# Patient Record
Sex: Female | Born: 1955
Health system: Southern US, Community
[De-identification: ages and names within clinical notes are randomized; demographics above are authoritative.]

## PROBLEM LIST (undated history)

## (undated) DIAGNOSIS — R109 Unspecified abdominal pain: Secondary | ICD-10-CM

## (undated) DIAGNOSIS — C189 Malignant neoplasm of colon, unspecified: Secondary | ICD-10-CM

## (undated) DIAGNOSIS — B373 Candidiasis of vulva and vagina: Secondary | ICD-10-CM

## (undated) DIAGNOSIS — D649 Anemia, unspecified: Secondary | ICD-10-CM

## (undated) DIAGNOSIS — D219 Benign neoplasm of connective and other soft tissue, unspecified: Secondary | ICD-10-CM

## (undated) DIAGNOSIS — K829 Disease of gallbladder, unspecified: Secondary | ICD-10-CM

## (undated) DIAGNOSIS — B3731 Acute candidiasis of vulva and vagina: Secondary | ICD-10-CM

## (undated) DIAGNOSIS — C180 Malignant neoplasm of cecum: Secondary | ICD-10-CM

## (undated) DIAGNOSIS — I10 Essential (primary) hypertension: Secondary | ICD-10-CM

## (undated) DIAGNOSIS — M1990 Unspecified osteoarthritis, unspecified site: Secondary | ICD-10-CM

## (undated) DIAGNOSIS — K279 Peptic ulcer, site unspecified, unspecified as acute or chronic, without hemorrhage or perforation: Secondary | ICD-10-CM

## (undated) HISTORY — DX: Disease of gallbladder, unspecified: K82.9

## (undated) HISTORY — DX: Anemia, unspecified: D64.9

## (undated) HISTORY — DX: Malignant neoplasm of cecum: C18.0

## (undated) HISTORY — PX: COLONOSCOPY: SHX174

## (undated) HISTORY — DX: Essential (primary) hypertension: I10

## (undated) HISTORY — PX: COLON SURGERY: SHX602

## (undated) HISTORY — DX: Unspecified osteoarthritis, unspecified site: M19.90

## (undated) HISTORY — DX: Peptic ulcer, site unspecified, unspecified as acute or chronic, without hemorrhage or perforation: K27.9

## (undated) HISTORY — DX: Benign neoplasm of connective and other soft tissue, unspecified: D21.9

## (undated) HISTORY — DX: Acute candidiasis of vulva and vagina: B37.31

## (undated) HISTORY — DX: Unspecified abdominal pain: R10.9

## (undated) HISTORY — DX: Malignant neoplasm of colon, unspecified: C18.9

## (undated) HISTORY — PX: OTHER SURGICAL HISTORY: SHX169

## (undated) HISTORY — DX: Candidiasis of vulva and vagina: B37.3

---

## 2003-08-30 ENCOUNTER — Inpatient Hospital Stay (HOSPITAL_COMMUNITY): Admission: EM | Admit: 2003-08-30 | Discharge: 2003-09-12 | Payer: Self-pay | Admitting: Emergency Medicine

## 2003-09-18 ENCOUNTER — Encounter: Admission: RE | Admit: 2003-09-18 | Discharge: 2003-09-18 | Payer: Self-pay | Admitting: Oncology

## 2003-09-18 ENCOUNTER — Encounter (HOSPITAL_COMMUNITY): Admission: RE | Admit: 2003-09-18 | Discharge: 2003-10-18 | Payer: Self-pay | Admitting: Oncology

## 2003-09-28 ENCOUNTER — Ambulatory Visit (HOSPITAL_COMMUNITY): Admission: RE | Admit: 2003-09-28 | Discharge: 2003-09-28 | Payer: Self-pay | Admitting: *Deleted

## 2003-10-22 ENCOUNTER — Encounter: Admission: RE | Admit: 2003-10-22 | Discharge: 2003-10-22 | Payer: Self-pay | Admitting: Oncology

## 2003-10-22 ENCOUNTER — Encounter (HOSPITAL_COMMUNITY): Admission: RE | Admit: 2003-10-22 | Discharge: 2003-11-21 | Payer: Self-pay | Admitting: Oncology

## 2003-11-27 ENCOUNTER — Encounter (HOSPITAL_COMMUNITY): Admission: RE | Admit: 2003-11-27 | Discharge: 2003-12-27 | Payer: Self-pay | Admitting: Oncology

## 2003-11-27 ENCOUNTER — Encounter: Admission: RE | Admit: 2003-11-27 | Discharge: 2003-11-27 | Payer: Self-pay | Admitting: Oncology

## 2003-12-25 ENCOUNTER — Ambulatory Visit (HOSPITAL_COMMUNITY): Payer: Self-pay | Admitting: Oncology

## 2004-01-04 ENCOUNTER — Encounter: Admission: RE | Admit: 2004-01-04 | Discharge: 2004-01-04 | Payer: Self-pay | Admitting: Oncology

## 2004-01-04 ENCOUNTER — Encounter (HOSPITAL_COMMUNITY): Admission: RE | Admit: 2004-01-04 | Discharge: 2004-02-03 | Payer: Self-pay | Admitting: Oncology

## 2004-02-08 ENCOUNTER — Encounter: Admission: RE | Admit: 2004-02-08 | Discharge: 2004-02-08 | Payer: Self-pay | Admitting: Oncology

## 2004-02-08 ENCOUNTER — Encounter (HOSPITAL_COMMUNITY): Admission: RE | Admit: 2004-02-08 | Discharge: 2004-02-27 | Payer: Self-pay | Admitting: Oncology

## 2004-03-21 ENCOUNTER — Ambulatory Visit (HOSPITAL_COMMUNITY): Payer: Self-pay | Admitting: Oncology

## 2004-03-21 ENCOUNTER — Encounter: Admission: RE | Admit: 2004-03-21 | Discharge: 2004-03-21 | Payer: Self-pay | Admitting: Oncology

## 2004-03-29 ENCOUNTER — Emergency Department (HOSPITAL_COMMUNITY): Admission: EM | Admit: 2004-03-29 | Discharge: 2004-03-29 | Payer: Self-pay | Admitting: Emergency Medicine

## 2004-04-01 ENCOUNTER — Other Ambulatory Visit: Admission: RE | Admit: 2004-04-01 | Discharge: 2004-04-01 | Payer: Self-pay | Admitting: *Deleted

## 2004-05-02 ENCOUNTER — Encounter: Admission: RE | Admit: 2004-05-02 | Discharge: 2004-05-02 | Payer: Self-pay | Admitting: Oncology

## 2004-05-02 ENCOUNTER — Encounter (HOSPITAL_COMMUNITY): Admission: RE | Admit: 2004-05-02 | Discharge: 2004-06-01 | Payer: Self-pay | Admitting: Oncology

## 2004-05-09 ENCOUNTER — Ambulatory Visit (HOSPITAL_COMMUNITY): Payer: Self-pay | Admitting: Oncology

## 2004-06-13 ENCOUNTER — Encounter (HOSPITAL_COMMUNITY): Admission: RE | Admit: 2004-06-13 | Discharge: 2004-07-13 | Payer: Self-pay | Admitting: Oncology

## 2004-06-13 ENCOUNTER — Encounter: Admission: RE | Admit: 2004-06-13 | Discharge: 2004-06-13 | Payer: Self-pay | Admitting: Oncology

## 2004-07-25 ENCOUNTER — Encounter: Admission: RE | Admit: 2004-07-25 | Discharge: 2004-07-25 | Payer: Self-pay | Admitting: Oncology

## 2004-07-25 ENCOUNTER — Ambulatory Visit (HOSPITAL_COMMUNITY): Payer: Self-pay | Admitting: Oncology

## 2004-09-03 ENCOUNTER — Encounter (HOSPITAL_COMMUNITY): Admission: RE | Admit: 2004-09-03 | Discharge: 2004-10-03 | Payer: Self-pay | Admitting: Oncology

## 2004-09-03 ENCOUNTER — Encounter: Admission: RE | Admit: 2004-09-03 | Discharge: 2004-09-03 | Payer: Self-pay | Admitting: Oncology

## 2004-10-15 ENCOUNTER — Encounter: Admission: RE | Admit: 2004-10-15 | Discharge: 2004-10-15 | Payer: Self-pay | Admitting: Oncology

## 2004-10-15 ENCOUNTER — Ambulatory Visit (HOSPITAL_COMMUNITY): Payer: Self-pay | Admitting: Oncology

## 2004-11-24 ENCOUNTER — Encounter: Admission: RE | Admit: 2004-11-24 | Discharge: 2004-11-24 | Payer: Self-pay | Admitting: Oncology

## 2004-11-24 ENCOUNTER — Encounter (HOSPITAL_COMMUNITY): Admission: RE | Admit: 2004-11-24 | Discharge: 2004-12-24 | Payer: Self-pay | Admitting: Oncology

## 2005-01-07 ENCOUNTER — Ambulatory Visit (HOSPITAL_COMMUNITY): Payer: Self-pay | Admitting: Oncology

## 2005-01-07 ENCOUNTER — Encounter: Admission: RE | Admit: 2005-01-07 | Discharge: 2005-01-07 | Payer: Self-pay | Admitting: Oncology

## 2005-02-18 ENCOUNTER — Encounter (HOSPITAL_COMMUNITY): Admission: RE | Admit: 2005-02-18 | Discharge: 2005-02-18 | Payer: Self-pay | Admitting: Oncology

## 2005-02-18 ENCOUNTER — Encounter: Admission: RE | Admit: 2005-02-18 | Discharge: 2005-02-18 | Payer: Self-pay | Admitting: Oncology

## 2005-02-24 ENCOUNTER — Encounter: Admission: RE | Admit: 2005-02-24 | Discharge: 2005-02-24 | Payer: Self-pay | Admitting: Oncology

## 2005-02-24 ENCOUNTER — Ambulatory Visit (HOSPITAL_COMMUNITY): Payer: Self-pay | Admitting: Oncology

## 2005-04-01 ENCOUNTER — Encounter: Admission: RE | Admit: 2005-04-01 | Discharge: 2005-04-01 | Payer: Self-pay | Admitting: Oncology

## 2005-04-01 ENCOUNTER — Encounter (HOSPITAL_COMMUNITY): Admission: RE | Admit: 2005-04-01 | Discharge: 2005-05-01 | Payer: Self-pay | Admitting: Oncology

## 2005-05-13 ENCOUNTER — Encounter: Admission: RE | Admit: 2005-05-13 | Discharge: 2005-05-13 | Payer: Self-pay | Admitting: Oncology

## 2005-05-13 ENCOUNTER — Ambulatory Visit (HOSPITAL_COMMUNITY): Payer: Self-pay | Admitting: Oncology

## 2005-05-13 ENCOUNTER — Encounter (HOSPITAL_COMMUNITY): Admission: RE | Admit: 2005-05-13 | Discharge: 2005-06-12 | Payer: Self-pay | Admitting: Oncology

## 2005-06-25 ENCOUNTER — Encounter: Admission: RE | Admit: 2005-06-25 | Discharge: 2005-06-25 | Payer: Self-pay | Admitting: Oncology

## 2005-08-07 ENCOUNTER — Encounter (HOSPITAL_COMMUNITY): Admission: RE | Admit: 2005-08-07 | Discharge: 2005-09-06 | Payer: Self-pay | Admitting: Oncology

## 2005-08-07 ENCOUNTER — Ambulatory Visit (HOSPITAL_COMMUNITY): Payer: Self-pay | Admitting: Oncology

## 2005-08-07 ENCOUNTER — Encounter: Admission: RE | Admit: 2005-08-07 | Discharge: 2005-08-07 | Payer: Self-pay | Admitting: Oncology

## 2005-09-28 ENCOUNTER — Encounter: Admission: RE | Admit: 2005-09-28 | Discharge: 2005-09-28 | Payer: Self-pay | Admitting: Oncology

## 2005-11-09 ENCOUNTER — Encounter (INDEPENDENT_AMBULATORY_CARE_PROVIDER_SITE_OTHER): Payer: Self-pay | Admitting: *Deleted

## 2005-11-09 ENCOUNTER — Ambulatory Visit: Payer: Self-pay | Admitting: Internal Medicine

## 2005-11-09 ENCOUNTER — Ambulatory Visit (HOSPITAL_COMMUNITY): Admission: RE | Admit: 2005-11-09 | Discharge: 2005-11-09 | Payer: Self-pay | Admitting: Internal Medicine

## 2005-11-10 ENCOUNTER — Encounter: Admission: RE | Admit: 2005-11-10 | Discharge: 2005-11-20 | Payer: Self-pay | Admitting: Oncology

## 2005-11-10 ENCOUNTER — Ambulatory Visit (HOSPITAL_COMMUNITY): Payer: Self-pay | Admitting: Oncology

## 2005-11-10 ENCOUNTER — Encounter (HOSPITAL_COMMUNITY): Admission: RE | Admit: 2005-11-10 | Discharge: 2005-11-20 | Payer: Self-pay | Admitting: Oncology

## 2005-12-22 ENCOUNTER — Encounter: Admission: RE | Admit: 2005-12-22 | Discharge: 2005-12-22 | Payer: Self-pay | Admitting: Oncology

## 2006-01-17 ENCOUNTER — Inpatient Hospital Stay (HOSPITAL_COMMUNITY): Admission: EM | Admit: 2006-01-17 | Discharge: 2006-01-22 | Payer: Self-pay | Admitting: Emergency Medicine

## 2006-02-02 ENCOUNTER — Ambulatory Visit (HOSPITAL_COMMUNITY): Payer: Self-pay | Admitting: Oncology

## 2006-02-26 ENCOUNTER — Encounter (HOSPITAL_COMMUNITY): Admission: RE | Admit: 2006-02-26 | Discharge: 2006-03-28 | Payer: Self-pay | Admitting: Oncology

## 2006-04-27 ENCOUNTER — Encounter (HOSPITAL_COMMUNITY): Admission: RE | Admit: 2006-04-27 | Discharge: 2006-05-27 | Payer: Self-pay | Admitting: Oncology

## 2006-04-27 ENCOUNTER — Ambulatory Visit (HOSPITAL_COMMUNITY): Payer: Self-pay | Admitting: Oncology

## 2006-07-20 ENCOUNTER — Ambulatory Visit (HOSPITAL_COMMUNITY): Payer: Self-pay | Admitting: Oncology

## 2006-07-20 ENCOUNTER — Encounter (HOSPITAL_COMMUNITY): Admission: RE | Admit: 2006-07-20 | Discharge: 2006-08-19 | Payer: Self-pay | Admitting: Oncology

## 2006-08-31 ENCOUNTER — Encounter (HOSPITAL_COMMUNITY): Admission: RE | Admit: 2006-08-31 | Discharge: 2006-09-30 | Payer: Self-pay | Admitting: Oncology

## 2006-09-15 ENCOUNTER — Ambulatory Visit (HOSPITAL_COMMUNITY): Payer: Self-pay | Admitting: Oncology

## 2006-10-12 ENCOUNTER — Encounter (HOSPITAL_COMMUNITY): Admission: RE | Admit: 2006-10-12 | Discharge: 2006-11-11 | Payer: Self-pay | Admitting: Oncology

## 2006-11-12 ENCOUNTER — Ambulatory Visit (HOSPITAL_COMMUNITY): Admission: RE | Admit: 2006-11-12 | Discharge: 2006-11-12 | Payer: Self-pay | Admitting: Obstetrics and Gynecology

## 2006-11-23 ENCOUNTER — Ambulatory Visit (HOSPITAL_COMMUNITY): Payer: Self-pay | Admitting: Oncology

## 2006-11-23 ENCOUNTER — Encounter (HOSPITAL_COMMUNITY): Admission: RE | Admit: 2006-11-23 | Discharge: 2006-12-23 | Payer: Self-pay | Admitting: Oncology

## 2007-02-15 ENCOUNTER — Ambulatory Visit (HOSPITAL_COMMUNITY): Payer: Self-pay | Admitting: Oncology

## 2007-02-15 ENCOUNTER — Encounter (HOSPITAL_COMMUNITY): Admission: RE | Admit: 2007-02-15 | Discharge: 2007-02-23 | Payer: Self-pay | Admitting: Oncology

## 2007-04-27 ENCOUNTER — Ambulatory Visit (HOSPITAL_COMMUNITY): Payer: Self-pay | Admitting: Oncology

## 2007-04-27 ENCOUNTER — Encounter (HOSPITAL_COMMUNITY): Admission: RE | Admit: 2007-04-27 | Discharge: 2007-05-27 | Payer: Self-pay | Admitting: Oncology

## 2007-07-20 ENCOUNTER — Encounter (HOSPITAL_COMMUNITY): Admission: RE | Admit: 2007-07-20 | Discharge: 2007-08-19 | Payer: Self-pay | Admitting: Oncology

## 2007-07-20 ENCOUNTER — Ambulatory Visit (HOSPITAL_COMMUNITY): Payer: Self-pay | Admitting: Oncology

## 2007-08-01 ENCOUNTER — Other Ambulatory Visit: Admission: RE | Admit: 2007-08-01 | Discharge: 2007-08-01 | Payer: Self-pay | Admitting: Obstetrics and Gynecology

## 2007-09-19 ENCOUNTER — Encounter (HOSPITAL_COMMUNITY): Admission: RE | Admit: 2007-09-19 | Discharge: 2007-10-19 | Payer: Self-pay | Admitting: Oncology

## 2007-09-19 ENCOUNTER — Ambulatory Visit (HOSPITAL_COMMUNITY): Payer: Self-pay | Admitting: Oncology

## 2007-11-01 ENCOUNTER — Encounter (HOSPITAL_COMMUNITY): Admission: RE | Admit: 2007-11-01 | Discharge: 2007-12-01 | Payer: Self-pay | Admitting: Oncology

## 2007-12-15 ENCOUNTER — Ambulatory Visit (HOSPITAL_COMMUNITY): Payer: Self-pay | Admitting: Oncology

## 2008-01-26 ENCOUNTER — Encounter (HOSPITAL_COMMUNITY): Admission: RE | Admit: 2008-01-26 | Discharge: 2008-02-25 | Payer: Self-pay | Admitting: Oncology

## 2008-03-08 ENCOUNTER — Encounter (HOSPITAL_COMMUNITY): Admission: RE | Admit: 2008-03-08 | Discharge: 2008-04-07 | Payer: Self-pay | Admitting: Oncology

## 2008-03-08 ENCOUNTER — Ambulatory Visit (HOSPITAL_COMMUNITY): Payer: Self-pay | Admitting: Oncology

## 2008-04-12 ENCOUNTER — Ambulatory Visit (HOSPITAL_COMMUNITY): Admission: RE | Admit: 2008-04-12 | Discharge: 2008-04-12 | Payer: Self-pay | Admitting: Internal Medicine

## 2008-04-19 ENCOUNTER — Encounter (HOSPITAL_COMMUNITY): Admission: RE | Admit: 2008-04-19 | Discharge: 2008-05-19 | Payer: Self-pay | Admitting: Oncology

## 2008-05-17 ENCOUNTER — Ambulatory Visit (HOSPITAL_COMMUNITY): Payer: Self-pay | Admitting: Oncology

## 2008-06-21 ENCOUNTER — Encounter (HOSPITAL_COMMUNITY): Admission: RE | Admit: 2008-06-21 | Discharge: 2008-07-21 | Payer: Self-pay | Admitting: Oncology

## 2008-08-02 ENCOUNTER — Ambulatory Visit (HOSPITAL_COMMUNITY): Payer: Self-pay | Admitting: Oncology

## 2008-08-03 ENCOUNTER — Other Ambulatory Visit: Admission: RE | Admit: 2008-08-03 | Discharge: 2008-08-03 | Payer: Self-pay | Admitting: Obstetrics and Gynecology

## 2008-09-11 ENCOUNTER — Encounter (HOSPITAL_COMMUNITY): Admission: RE | Admit: 2008-09-11 | Discharge: 2008-10-11 | Payer: Self-pay | Admitting: Oncology

## 2008-10-26 ENCOUNTER — Ambulatory Visit (HOSPITAL_COMMUNITY): Payer: Self-pay | Admitting: Oncology

## 2008-12-07 ENCOUNTER — Encounter (HOSPITAL_COMMUNITY): Admission: RE | Admit: 2008-12-07 | Discharge: 2009-01-06 | Payer: Self-pay | Admitting: Oncology

## 2008-12-12 ENCOUNTER — Ambulatory Visit (HOSPITAL_COMMUNITY): Admission: RE | Admit: 2008-12-12 | Discharge: 2008-12-12 | Payer: Self-pay | Admitting: Oncology

## 2009-01-25 ENCOUNTER — Ambulatory Visit (HOSPITAL_COMMUNITY): Payer: Self-pay | Admitting: Oncology

## 2009-03-22 ENCOUNTER — Ambulatory Visit (HOSPITAL_COMMUNITY): Payer: Self-pay | Admitting: Oncology

## 2009-03-22 ENCOUNTER — Encounter (HOSPITAL_COMMUNITY): Admission: RE | Admit: 2009-03-22 | Discharge: 2009-04-21 | Payer: Self-pay | Admitting: Oncology

## 2009-06-12 ENCOUNTER — Ambulatory Visit (HOSPITAL_COMMUNITY): Payer: Self-pay | Admitting: Oncology

## 2009-06-13 ENCOUNTER — Encounter (HOSPITAL_COMMUNITY): Admission: RE | Admit: 2009-06-13 | Discharge: 2009-07-13 | Payer: Self-pay | Admitting: Oncology

## 2009-09-05 ENCOUNTER — Ambulatory Visit (HOSPITAL_COMMUNITY): Payer: Self-pay | Admitting: Oncology

## 2009-09-05 ENCOUNTER — Encounter (HOSPITAL_COMMUNITY): Admission: RE | Admit: 2009-09-05 | Discharge: 2009-10-05 | Payer: Self-pay | Admitting: Oncology

## 2009-09-18 ENCOUNTER — Other Ambulatory Visit: Admission: RE | Admit: 2009-09-18 | Discharge: 2009-09-18 | Payer: Self-pay | Admitting: Obstetrics and Gynecology

## 2009-11-06 ENCOUNTER — Encounter (HOSPITAL_COMMUNITY)
Admission: RE | Admit: 2009-11-06 | Discharge: 2009-11-22 | Payer: Self-pay | Source: Home / Self Care | Admitting: Oncology

## 2009-11-06 ENCOUNTER — Ambulatory Visit (HOSPITAL_COMMUNITY): Payer: Self-pay | Admitting: Oncology

## 2009-12-05 ENCOUNTER — Encounter (HOSPITAL_COMMUNITY)
Admission: RE | Admit: 2009-12-05 | Discharge: 2010-01-04 | Payer: Self-pay | Source: Home / Self Care | Admitting: Oncology

## 2010-01-23 ENCOUNTER — Encounter (HOSPITAL_COMMUNITY)
Admission: RE | Admit: 2010-01-23 | Discharge: 2010-02-22 | Payer: Self-pay | Source: Home / Self Care | Attending: Oncology | Admitting: Oncology

## 2010-01-23 ENCOUNTER — Ambulatory Visit (HOSPITAL_COMMUNITY): Payer: Self-pay | Admitting: Oncology

## 2010-03-06 ENCOUNTER — Encounter (HOSPITAL_COMMUNITY)
Admission: RE | Admit: 2010-03-06 | Discharge: 2010-03-25 | Payer: Self-pay | Source: Home / Self Care | Attending: Oncology | Admitting: Oncology

## 2010-03-10 LAB — DIFFERENTIAL
Basophils Absolute: 0 10*3/uL (ref 0.0–0.1)
Basophils Relative: 1 % (ref 0–1)
Eosinophils Absolute: 0.1 10*3/uL (ref 0.0–0.7)
Eosinophils Relative: 1 % (ref 0–5)
Lymphocytes Relative: 41 % (ref 12–46)
Lymphs Abs: 2.1 10*3/uL (ref 0.7–4.0)
Monocytes Absolute: 0.3 10*3/uL (ref 0.1–1.0)
Monocytes Relative: 7 % (ref 3–12)
Neutro Abs: 2.6 10*3/uL (ref 1.7–7.7)
Neutrophils Relative %: 51 % (ref 43–77)

## 2010-03-10 LAB — CBC
HCT: 37 % (ref 36.0–46.0)
Hemoglobin: 12.1 g/dL (ref 12.0–15.0)
MCH: 26.5 pg (ref 26.0–34.0)
MCHC: 32.7 g/dL (ref 30.0–36.0)
MCV: 81.1 fL (ref 78.0–100.0)
Platelets: 216 10*3/uL (ref 150–400)
RBC: 4.56 MIL/uL (ref 3.87–5.11)
RDW: 14.7 % (ref 11.5–15.5)
WBC: 5.2 10*3/uL (ref 4.0–10.5)

## 2010-03-10 LAB — COMPREHENSIVE METABOLIC PANEL
ALT: 22 U/L (ref 0–35)
AST: 27 U/L (ref 0–37)
Albumin: 3.5 g/dL (ref 3.5–5.2)
Alkaline Phosphatase: 82 U/L (ref 39–117)
BUN: 12 mg/dL (ref 6–23)
CO2: 27 mEq/L (ref 19–32)
Calcium: 9 mg/dL (ref 8.4–10.5)
Chloride: 105 mEq/L (ref 96–112)
Creatinine, Ser: 0.83 mg/dL (ref 0.4–1.2)
GFR calc Af Amer: >60 mL/min (ref >60)
GFR calc non Af Amer: >60 mL/min (ref >60)
Glucose, Bld: 187 mg/dL — ABNORMAL HIGH (ref 70–99)
Potassium: 3.7 mEq/L (ref 3.5–5.1)
Sodium: 140 mEq/L (ref 135–145)
Total Bilirubin: 0.6 mg/dL (ref 0.3–1.2)
Total Protein: 7.2 g/dL (ref 6.0–8.3)

## 2010-03-10 LAB — CEA: CEA: 0.5 ng/mL (ref 0.0–5.0)

## 2010-03-15 ENCOUNTER — Encounter (HOSPITAL_COMMUNITY): Payer: Self-pay | Admitting: Oncology

## 2010-03-16 ENCOUNTER — Encounter (HOSPITAL_COMMUNITY): Payer: Self-pay | Admitting: Oncology

## 2010-03-16 ENCOUNTER — Encounter: Payer: Self-pay | Admitting: General Surgery

## 2010-04-17 ENCOUNTER — Encounter (HOSPITAL_COMMUNITY): Payer: Self-pay

## 2010-05-11 LAB — DIFFERENTIAL
Basophils Absolute: 0 10*3/uL (ref 0.0–0.1)
Basophils Relative: 1 % (ref 0–1)
Eosinophils Absolute: 0.1 10*3/uL (ref 0.0–0.7)
Eosinophils Relative: 2 % (ref 0–5)
Lymphs Abs: 1.6 10*3/uL (ref 0.7–4.0)
Neutrophils Relative %: 59 % (ref 43–77)

## 2010-05-11 LAB — CBC
HCT: 37.5 % (ref 36.0–46.0)
Hemoglobin: 12.4 g/dL (ref 12.0–15.0)
MCHC: 33 g/dL (ref 30.0–36.0)
MCV: 82.1 fL (ref 78.0–100.0)
RDW: 14.9 % (ref 11.5–15.5)
WBC: 5.7 10*3/uL (ref 4.0–10.5)

## 2010-05-11 LAB — COMPREHENSIVE METABOLIC PANEL
ALT: 29 U/L (ref 0–35)
CO2: 26 mEq/L (ref 19–32)
Calcium: 8.6 mg/dL (ref 8.4–10.5)
Chloride: 107 mEq/L (ref 96–112)
GFR calc non Af Amer: >60 mL/min (ref >60)
Glucose, Bld: 181 mg/dL — ABNORMAL HIGH (ref 70–99)
Sodium: 140 mEq/L (ref 135–145)
Total Bilirubin: 0.4 mg/dL (ref 0.3–1.2)

## 2010-05-11 LAB — CEA: CEA: 0.8 ng/mL (ref 0.0–5.0)

## 2010-05-13 LAB — CEA: CEA: 1.3 ng/mL (ref 0.0–5.0)

## 2010-05-22 ENCOUNTER — Encounter (HOSPITAL_COMMUNITY): Payer: BC Managed Care – PPO | Attending: Oncology

## 2010-05-22 ENCOUNTER — Encounter (HOSPITAL_COMMUNITY): Payer: BC Managed Care – PPO

## 2010-05-22 ENCOUNTER — Other Ambulatory Visit (HOSPITAL_COMMUNITY): Payer: Self-pay | Admitting: Oncology

## 2010-05-22 DIAGNOSIS — Z85038 Personal history of other malignant neoplasm of large intestine: Secondary | ICD-10-CM | POA: Insufficient documentation

## 2010-05-22 DIAGNOSIS — C189 Malignant neoplasm of colon, unspecified: Secondary | ICD-10-CM

## 2010-06-01 LAB — DIFFERENTIAL
Basophils Absolute: 0 10*3/uL (ref 0.0–0.1)
Basophils Relative: 0 % (ref 0–1)
Lymphocytes Relative: 32 % (ref 12–46)
Monocytes Absolute: 0.4 10*3/uL (ref 0.1–1.0)
Monocytes Relative: 8 % (ref 3–12)
Neutro Abs: 2.9 10*3/uL (ref 1.7–7.7)
Neutrophils Relative %: 58 % (ref 43–77)

## 2010-06-01 LAB — CBC
HCT: 38.1 % (ref 36.0–46.0)
MCHC: 34.3 g/dL (ref 30.0–36.0)
MCV: 82.1 fL (ref 78.0–100.0)
Platelets: 180 10*3/uL (ref 150–400)
RDW: 14.6 % (ref 11.5–15.5)
WBC: 5 10*3/uL (ref 4.0–10.5)

## 2010-06-01 LAB — COMPREHENSIVE METABOLIC PANEL
Albumin: 3.7 g/dL (ref 3.5–5.2)
Alkaline Phosphatase: 89 U/L (ref 39–117)
BUN: 13 mg/dL (ref 6–23)
Creatinine, Ser: 0.64 mg/dL (ref 0.4–1.2)
Glucose, Bld: 113 mg/dL — ABNORMAL HIGH (ref 70–99)
Total Protein: 7.3 g/dL (ref 6.0–8.3)

## 2010-06-01 LAB — LIPID PANEL
HDL: 57 mg/dL (ref >39)
LDL Cholesterol: 176 mg/dL — ABNORMAL HIGH (ref 0–99)
Triglycerides: 70 mg/dL (ref <150)
VLDL: 14 mg/dL (ref 0–40)

## 2010-06-01 LAB — CEA: CEA: 1 ng/mL (ref 0.0–5.0)

## 2010-06-04 LAB — CEA: CEA: 1.2 ng/mL (ref 0.0–5.0)

## 2010-06-09 LAB — COMPREHENSIVE METABOLIC PANEL
ALT: 24 U/L (ref 0–35)
AST: 28 U/L (ref 0–37)
Albumin: 3.6 g/dL (ref 3.5–5.2)
CO2: 26 mEq/L (ref 19–32)
Chloride: 104 mEq/L (ref 96–112)
Creatinine, Ser: 0.61 mg/dL (ref 0.4–1.2)
GFR calc Af Amer: >60 mL/min (ref >60)
GFR calc non Af Amer: >60 mL/min (ref >60)
Potassium: 3.2 mEq/L — ABNORMAL LOW (ref 3.5–5.1)
Sodium: 139 mEq/L (ref 135–145)
Total Bilirubin: 0.6 mg/dL (ref 0.3–1.2)

## 2010-06-09 LAB — DIFFERENTIAL
Basophils Absolute: 0 10*3/uL (ref 0.0–0.1)
Eosinophils Absolute: 0 10*3/uL (ref 0.0–0.7)
Eosinophils Relative: 0 % (ref 0–5)
Lymphocytes Relative: 37 % (ref 12–46)
Monocytes Absolute: 0.4 10*3/uL (ref 0.1–1.0)

## 2010-06-09 LAB — CBC
HCT: 37.5 % (ref 36.0–46.0)
MCHC: 33.2 g/dL (ref 30.0–36.0)
MCV: 81.2 fL (ref 78.0–100.0)
RBC: 4.62 MIL/uL (ref 3.87–5.11)
WBC: 4.7 10*3/uL (ref 4.0–10.5)

## 2010-06-09 LAB — CEA: CEA: 0.8 ng/mL (ref 0.0–5.0)

## 2010-07-03 ENCOUNTER — Encounter (HOSPITAL_COMMUNITY): Payer: BC Managed Care – PPO | Attending: Oncology

## 2010-07-03 DIAGNOSIS — Z452 Encounter for adjustment and management of vascular access device: Secondary | ICD-10-CM

## 2010-07-03 DIAGNOSIS — C189 Malignant neoplasm of colon, unspecified: Secondary | ICD-10-CM

## 2010-07-08 NOTE — Op Note (Signed)
Vanessa [name], Vanessa [name]               ACCOUNT NO.:  [id number]   MEDICAL RECORD NO.:  [id number]          PATIENT TYPE:  AMB   LOCATION:  DAY                           FACILITY:  [facility]   PHYSICIAN:  Lionel December, M.D.    DATE OF BIRTH:  1955/03/01   DATE OF PROCEDURE:  04/12/2008  DATE OF DISCHARGE:                               OPERATIVE REPORT   PROCEDURE:  Esophagogastroduodenoscopy.   INDICATION:  Vanessa [name] is a 55 year old African American female with  history of colon carcinoma with surgery in July 2005 and remained in  remission.  She recently had abdominopelvic CT by Dr. Mariel Sleet and it  showed gastric wall thickening.  She is therefore undergoing diagnostic  EGD.  She complaints of frequent heartburn.  She was given a  prescription for Prevacid that she has been taking after meals.  She  denies dysphagia, abdominal pain, melena, anorexia, weight loss.   The patient's last surveillance colonoscopy was in September 2007.  Her  CEA has remained normal.   PROCEDURE:  The risks were reviewed.  The patient informed consent was  obtained.   MEDICATIONS FOR CONSCIOUS SEDATION:  Cetacaine spray for oropharyngeal  topical anesthesia, Demerol 50 mg IV, Versed 7 mg IV in divided dose.   FINDINGS:  Procedure performed in endoscopy suite.  The patient's vital  signs and O2 sats were monitored during the procedure and remained  stable.  The patient was placed in left lateral position and Pentax  videoscope was passed in oropharynx without difficulty into esophagus.   The mucosa of the esophagus was normal.  GE junction was at 35 cm with  normal sizes and was unremarkable.  Hiatus was at 37.  Mucosa of the  herniated part of the stomach was normal.   Stomach was emptied and distended very well insufflation.  Folds of  proximal stomach were normal.  A few antral erosions were noted along  with the patchy erythema, but no ulcer crater was found.  There was no  mass either.  Pyloric channel  was patent.  Annular sinus and cardia were  examined bilaterally with endoscope, were normal.   Duodenum and bulbar mucosa were normal.  Scope was passed from the  second part of duodenal mucosa and folds were normal.  Endoscope was  withdrawn.  The patient tolerated the procedure well.   FINAL DIAGNOSES:  1. No evidence of gastric neoplasm.  2. Erosive antral gastritis which explained CT findings.  3. Small sliding hiatal hernia without erosive esophagitis.   RECOMMENDATIONS:  She will continue antireflux measures and Prevacid as  before, but take it 30 minutes before breakfast.   We will check her H. pylori serologies today and let the patient know  the results in near future.      Lionel December, M.D.  Electronically Signed     NR/MEDQ  D:  04/12/2008  T:  04/13/2008  Job:  [id number]   cc:   Ladona Horns. Mariel Sleet, MD  Fax: [phone]

## 2010-07-11 NOTE — Op Note (Signed)
NAME:  Vanessa [name], TETRO NO.:  [id number]   MEDICAL RECORD NO.:  [id number]                   PATIENT TYPE:   LOCATION:                                       FACILITY:   PHYSICIAN:  Dalia Heading, M.D.               DATE OF BIRTH:   DATE OF PROCEDURE:  09/28/2003  DATE OF DISCHARGE:                                 OPERATIVE REPORT   PREOPERATIVE DIAGNOSIS:  Colon carcinoma, need for central venous access for  chemotherapy.   POSTOPERATIVE DIAGNOSIS:  Colon carcinoma, need for central venous access  for chemotherapy.   PROCEDURE:  Port-A-Cath insertion.   SURGEON:  Dalia Heading, M.D.   ANESTHESIA:  MAC   INDICATIONS:  The patient is 55 year old black female who presents for a  Port-A-Cath insertion as she will be needing chemotherapy to treat colon  cancer.  The risks and benefits of the procedure including bleeding,  infection, and pneumothorax were fully explained to the patient, who gave  informed consent.   DESCRIPTION OF PROCEDURE:  The patient was placed in the Trendelenburg  position after the left upper chest was prepped and draped using the usual  sterile technique with Betadine.  Surgical site confirmation was performed.  Xylocaine 1% was used for local anesthesia.   A transverse incision was made below the left clavicle.  A subcutaneous  pocket was then formed. A needle was advanced into the left subclavian vein  without difficulty.  A guidewire was then advanced under fluoroscopic  guidance into the superior vena cava.  An introducer and peel-away sheath  were placed over the guidewire and the guidewire was removed.   The catheter was then inserted through the peel-away sheath and the peel-  away sheath was removed.  The catheter was attached to the port, and the  port placed in its subcutaneous pocket.  Adequate positioning was found on  fluoroscopy.  The port was then flushed with 3000 units of heparin.  The  subcutaneous  layer was reapproximated using a 3-0 Vicryl interrupted suture.  The skin was closed using a 4-0 Vicryl subcuticular suture.  Steri-Strips  and a dry sterile dressing were applied.  All tape and needle counts were  correct at the end of the procedure.  The patient was transferred to PACU  stable condition.  A chest x-ray will be performed at that time.   COMPLICATIONS:  None.   SPECIMEN:  None.   BLOOD LOSS:  Minimal.      ___________________________________________                                            Dalia Heading, M.D.   MAJ/MEDQ  D:  09/28/2003  T:  09/30/2003  Job:  [id number]   cc:   Dalia Heading, M.D.  [phone]  [address] [place]  [place] [address]  Fax: [phone]   Ladona Horns. Neijstrom, MD  [address]  [place]  [place] [address]  Fax: [phone]   Lionel December, M.D.  [address]  [place]  [place] [address]  Fax: [phone]   Dirk Dress. Katrinka Blazing, M.D.  [address]  [place]  [place] [address]  Fax: [phone]

## 2010-07-11 NOTE — Discharge Summary (Signed)
NAME:  Vanessa [name], Vanessa [name] NO.:  [id number]   MEDICAL RECORD NO.:  [id number]          PATIENT TYPE:  INP   LOCATION:  A336                          FACILITY:  [facility]   PHYSICIAN:  Dalia Heading, M.D.  DATE OF BIRTH:  Mar 08, 1955   DATE OF ADMISSION:  01/17/2006  DATE OF DISCHARGE:  11/30/2007LH                               DISCHARGE SUMMARY   HOSPITAL COURSE:  The patient is a 55 year old, black female with a  history of colon carcinoma who presented to the emergency room with  nausea and vomiting and epigastric pain.  A CT scan of the abdomen and  pelvis was performed which revealed mildly dilated small bowel with a  question of a small-bowel obstruction versus ileus.  She did not  tolerate the oral contrast well.  Her CBC was within normal limits.  Chem-7 was remarkable only for potassium of 3.3.  Liver profile was  within normal limits.  An H. pylori serology was performed which  revealed positivity and thus, she was started on amoxicillin and Biaxin.  Ultrasound of the gallbladder was negative.  HIDA scan did reveal no  emptying consistent with chronic cholecystitis, though this could be  secondary to her gastritis.  Her diet was advanced without difficulty.  It was elected to not remove the gallbladder at this time.   The patient is being discharged home on January 22, 2006, in good and  improving condition.   FOLLOW UP:  The patient is to follow up with Dr. Franky Macho on  February 02, 2006.   DISCHARGE MEDICATIONS:  1. Amoxicillin 1 g p.o. b.i.d. x10 days.  2. Biaxin 500 mg p.o. b.i.d. x10 days.  3. Dilaudid one tablet p.o. q.4 h. p.r.n. pain.  4. Prevacid 30 mg p.o. b.i.d.   She is to resume all her medications as previously prescribed.   DISCHARGE DIAGNOSES:  1. Gastritis, Helicobacter pylori positivity.  2. Chronic cholecystitis.  3. History of colon carcinoma.  4. Small-bowel obstruction, resolved.  5. Hypertension.   PROCEDURES:   None.     Dalia Heading, M.D.  Electronically Signed    MAJ/MEDQ  D:  01/22/2006  T:  01/22/2006  Job:  [id number]

## 2010-07-11 NOTE — Discharge Summary (Signed)
NAME:  Vanessa [name], Vanessa [name] NO.:  [id number]   MEDICAL RECORD NO.:  [id number]                   PATIENT TYPE:  INP   LOCATION:  A308                                 FACILITY:  [facility]   PHYSICIAN:  Dalia Heading, M.D.               DATE OF BIRTH:  01-11-1956   DATE OF ADMISSION:  08/30/2003  DATE OF DISCHARGE:  09/12/2003                                 DISCHARGE SUMMARY   HISTORY OF PRESENT ILLNESS AND HOSPITAL COURSE:  The patient is a 55-year-  old black female who was admitted by the hospitalist for workup of  generalized weakness and dizziness.  She was found at the time of admission  to be severely anemic, with a hematocrit of 12.  She was admitted to the  hospital for blood transfusions and further workup.  Dr. Karilyn Cota of  gastroenterology was consulted, and the patient's subsequently underwent a  colonoscopy and CT scan.  She was found to have a tumor in the cecum.  An  EGD revealed a small pyloric channel ulcer.  Her preoperative CEA level was  calculated at less than 0.5.  A surgery consultation was obtained.  The  patient, after receiving multiple blood transfusions, subsequently underwent  a right hemicolectomy.  This was performed on 09/03/2003.  She tolerated the  procedure well.  The final pathology did reveal a moderately differentiated  adenocarcinoma with multiple lymph nodes present that were positive for  malignancy.  The final pathology report is pending.   The patient's postoperative course was for the most part unremarkable.  The  patient's diet was advanced without difficulty once her bowel function  returned.   She is being discharged home on 09/12/2003 in good and stable condition.   DISCHARGE INSTRUCTIONS:  The patient is to follow up with Dr. Franky Macho  on 09/18/2003.  Arrangements to be seen by Dr. Mariel Sleet will be made at that  time.   DISCHARGE MEDICATIONS:  1. Dilaudid 2 to 4 mg p.o. q.4h. p.r.n. pain.  2. Prevacid 30 mg  p.o. daily.   PRINCIPAL DIAGNOSES:  1. 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  2. Anemia.  3. Peptic ulcer disease.   PRINCIPAL PROCEDURE:  EGD and colonoscopy by Dr. Karilyn Cota on 09/01/2003 and  right hemicolectomy by Dr. Franky Macho on 09/03/2003.     ___________________________________________                                         Dalia Heading, M.D.   MAJ/MEDQ  D:  09/12/2003  T:  09/12/2003  Job:  [id number]   cc:   Ladona Horns. Neijstrom, MD  [address]  [place]  [place] [address]  Fax: [phone]   Dirk Dress. Katrinka Blazing, M.D.  [address]  [place]  [place] [address]  Fax: [phone]  Lionel December, M.D.  [address]  [place]  [place] [address]  Fax: [phone]

## 2010-07-11 NOTE — Consult Note (Signed)
NAME:  Vanessa [name], Vanessa [name] NO.:  [id number]   MEDICAL RECORD NO.:  [id number]                   PATIENT TYPE:  INP   LOCATION:  A224                                 FACILITY:  [facility]   PHYSICIAN:  Lionel December, M.D.                 DATE OF BIRTH:  1955/07/03   DATE OF CONSULTATION:  08/31/2003  DATE OF DISCHARGE:                                   CONSULTATION   REFERRING PHYSICIAN:  Vania Rea, M.D.   HISTORY OF PRESENT ILLNESS:  The patient is a 55 year old, African-American  female who was admitted with profound microcytic anemia and Hemoccult  positive stools.  She states that over the last several months she has noted  progressive weakness and fatigue.  She has also had some intermittent  shortness of breath, but tells me that she has limited her activity  secondary to this and therefore, has not had as many symptoms.  The last  week or so, she has progressively worsened.  She actually saw Dr. Katrinka Blazing  because of an optometry exam suggestive of diabetes.  Blood work revealed a  hemoglobin of 3.2.  She was Hemoccult positive by rectal examination in his  office according to her.  She has had some right lower quadrant abdominal  pain for several months and has actually been self-treating herself with  Tylenol, Aleve and Motrin, generally two doses a day of one of these  medications.  She denies any melena or bright red blood per rectum,  constipation, diarrhea, heartburn or unintentional weight loss.  She has had  some abdominal rumbling for which she takes Gas-X or Alka-Seltzer p.r.n.  She has not had any of these two medications for more than three weeks now,  however.  She denies any family history of colorectal cancer or chronic GI  illnesses.   On admission, she was found to have a hemoglobin of 3.1, hematocrit 11.6,  MCV 57.2 and platelets 699,000.  Iron studies are pending.  She has since  received one unit of packed red blood cells with two  more units to be given.   MEDICATIONS PRIOR TO ADMISSION:  1. Allegra p.r.n.  2. Tylenol, Aleve or Motrin two doses daily.  3. Alka-Seltzer or Gas-X p.r.n.   ALLERGIES:  No known drug allergies.   PAST MEDICAL HISTORY:  Negative for any chronic illnesses.   PAST SURGICAL HISTORY:  None.   FAMILY HISTORY:  Brother died of MI at age 2.  No family history of  colorectal cancer or chronic GI illnesses.   SOCIAL HISTORY:  She is single.  She runs a day care.  She has two grown  daughters.  Denies any tobacco or alcohol use.   REVIEW OF SYMPTOMS:  GASTROINTESTINAL:  Please see HPI.  GENITOURINARY:  Denies any vaginal bleeding or hematuria.   PHYSICAL EXAMINATION:  VITAL SIGNS:  Height 63 inches, weight 124.8,  temperature 98.8, pulse 113, respirations 20,  blood pressure 129/66.  GENERAL:  Pleasant, well-developed, well-nourished, African-American female  in no acute distress.  She is quite pale.  SKIN:  Warm and dry.  No jaundice.  HEENT:  Conjunctivae are pale.  Sclerae nonicteric.  Oropharyngeal mucosa  moist and pink.  CHEST:  Lungs are clear to auscultation.  CARDIAC:  Regular rate and rhythm with 3/6 systolic ejection murmur heard  best in the upper precordium.  ABDOMEN:  Positive bowel sounds, soft, nondistended.  She has moderate  tenderness to the right lower quadrant to deep palpation and also has some  fullness in this area.  No discrete mass palpated.  No hepatosplenomegaly,  rebound tenderness or guarding.  EXTREMITIES:  No edema.   LABORATORY DATA AND X-RAY FINDINGS:  Sodium 135, potassium 4.1, BUN 7,  creatinine 0.7, glucose 126.  Total bilirubin 0.2, Alk phos 62, SGOT 16,  SGPT 11, albumin 3.1.  Total CK 35, CK-MB 0.4, troponin 0.02.   IMPRESSION:  The patient is a 55 year old, African-American female with  profound microcytic anemia, probable iron deficiency anemia.  She has also  had Hemoccult positive stools.  Suspect that she has a chronic/intermittent   gastrointestinal bleed, source unclear at this time.  Given her right lower  quadrant abdominal pain, would be concerned for a colonic source, however.  Cannot rule out inflammatory bowel disease, although less likely given that  she has had no diarrhea.  Also, cannot rule out peptic ulcer disease given  significant nonsteroidal anti-inflammatory drugs use.   RECOMMENDATIONS:  1. Will proceed of computed tomography of the abdomen and pelvis today given     significant right lower quadrant abdominal pain.  2. Continue packed red blood cell transfusion as needed.  3. Will follow for CBC post her third unit of packed red blood cells today.  4. Plan on colonoscopy and upper endoscopy in the morning.  5. Given her heart murmur, would consider SBE prophylaxis.   I would like to thank Dr. Orvan Falconer for allowing [place] to take pare in the care  of this patient.     ________________________________________  ___________________________________________  Tana Coast, P.A.                         Lionel December, M.D.   LL/MEDQ  D:  08/31/2003  T:  08/31/2003  Job:  [id number]   cc:   Lionel December, M.D.  [address]  [place]  [place] [address]  Fax: [phone]   Dirk Dress. Katrinka Blazing, M.D.  [address]  [place]  [place] [address]  Fax: [phone]

## 2010-07-11 NOTE — Op Note (Signed)
NAME:  Vanessa [name], Vanessa [name] NO.:  [id number]   MEDICAL RECORD NO.:  [id number]                   PATIENT TYPE:  INP   LOCATION:  A224                                 FACILITY:  [facility]   PHYSICIAN:  Lionel December, M.D.                 DATE OF BIRTH:  1955-04-21   DATE OF PROCEDURE:  09/01/2003  DATE OF DISCHARGE:                                 OPERATIVE REPORT   PROCEDURE:  Esophagogastroduodenoscopy followed by total colonoscopy.   INDICATION:  Vanessa [name] is a 55 year old, African-American female, who  presents with iron deficiency anemia.  She has a hemoglobin on admission of  3.1.  She has been having right lower quadrant pain intermittently for  several months.  CT suggests a mass in this area.  There is no history of  melena or rectal bleeding, although her stool was heme-positive.  She is  undergoing diagnostic evaluation.  Procedure risks were reviewed with the  patient.  Informed consent was obtained.   PREOPERATIVE MEDICATIONS:  1. Cetacaine spray for pharyngeal topical anesthesia.  2. Demerol 50 mg IV.  3. Versed 5 mg IV in divided dose.   FINDINGS:  Procedure performed in endoscopy suite.  The patient's vital  signs and O2 saturations were monitored during the procedure and remained  stable.   PROCEDURE #1:  Esophagogastroduodenoscopy.  The patient was placed in the  left lateral decubitus position.  Olympus video scope was passed via  oropharynx without any difficulty into esophagus.   ESOPHAGUS:  Mucosa of the esophagus was normal throughout.  Squamocolumnar  junction was unremarkable.   STOMACH:  It was empty and distended very well with insufflation.  Folds of  the proximal stomach were normal.  Examination of mucosa revealed few  erosions at antrum and swollen prepyloric mucosa which was hiding an ulcer 7-  8 mm at pyloric channel which was patent.  Angularis, fundus, cardia were  examined by retroflexed video scope and were  normal.   DUODENUM:  Examination of the bulb and postbulbar duodenum was normal.   Endoscope was withdrawn and patient prepared for procedure #2.   TOTAL COLONOSCOPY:  Rectal examination performed.  No abnormality noted on  external or digital exam.  Olympus video scope was placed in rectum and  advanced under vision to sigmoid colon beyond.  Preparation was  satisfactory.  The scope was passed into what was felt to be cecum.  Landmarks were somewhat obliterated because of a large, fungating, ulcerated  mass which was quite bulky.  Pictures were taken followed by multiple  biopsies.  There were some satellite lesions surrounding this big mass.  As  the scope was withdrawn, the rest of the colonic mucosa was carefully  examined, and no other abnormalities were noted.  Rectal mucosa was normal.  The scope was retroflexed to examine anorectal junction, and small  hemorrhoids were noted below the dentate line.  Endoscope was straightened  and withdrawn.  The patient tolerated the procedure well.   FINAL DIAGNOSES:  1. Pyloric channel ulcer.  2. Erosive gastritis.  H. pylori infection suspected.  3. Right colonic/cecal fungating ulcerated mass which is quite large.     Biopsies taken.  Endoscopic appearance typical of carcinoma.   Given the presentation, I am concerned that she is at risk for a bowel  obstruction at the level of ileocecal valve and therefore should undergo  surgery as soon as possible.   She will need more transfusion since some blood loss will be expected at  surgery.   RECOMMENDATIONS:  1. Protonix 40 mg p.o. b.i.d.  2. H. pylori serology.  3. As discussed with Dr. Loleta Chance, will transfuse 2 more units.  4. Have requested Dr. Lovell Sheehan to see the patient since Dr. Elpidio Anis, the     patient's primary surgeon is out of town for next week.      ___________________________________________                                            Lionel December, M.D.   NR/MEDQ  D:   09/01/2003  T:  09/01/2003  Job:  [id number]   cc:   Annia Friendly. Loleta Chance, M.D.  [address]  [place]  [place] [address]  Fax: [phone]   Ladona Horns. Neijstrom, MD  [address]  [place]  [place] [address]  Fax: [phone]

## 2010-07-11 NOTE — Op Note (Signed)
NAME:  Vanessa [name], Vanessa [name] NO.:  [id number]   MEDICAL RECORD NO.:  [id number]                   PATIENT TYPE:  INP   LOCATION:  A308                                 FACILITY:  [facility]   PHYSICIAN:  Dalia Heading, M.D.               DATE OF BIRTH:  1955-06-15   DATE OF PROCEDURE:  09/03/2003  DATE OF DISCHARGE:                                 OPERATIVE REPORT   AGE:  55 years old.   PREOPERATIVE DIAGNOSIS:  Cecal mass.   POSTOPERATIVE DIAGNOSIS:  Cecal mass.   PROCEDURE:  Right hemicolectomy.   SURGEON:  Dr. Franky Macho.   ANESTHESIA:  General endotracheal.   INDICATIONS:  Patient is a 55 year old black female who underwent a  colonoscopy by Dr. Karilyn Cota for workup of anemia and was found to have a cecal  mass.  A CT scan of the abdomen and pelvis confirmed a cecal mass as well as  mesenteric lymphadenopathy.  The patient now comes to the operating room for  a right hemicolectomy.  The risks and benefits of the procedure including  bleeding, infection, the possibility of a blood transfusion, and the  possibility of malignancy being found were fully explained to the patient,  who gave informed consent.   PROCEDURE NOTE:  The patient was placed in the supine position.  After  induction of general endotracheal anesthesia, the abdomen was prepped and  draped using the usual sterile technique with Betadine.  Surgical site  confirmation was performed.   A midline incision was made from about the level of the umbilicus  inferiorly.  The peritoneal cavity was entered into without difficulty.  The  liver, gallbladder, small intestine proximal to the terminal ileum were all  within normal limits.  No abnormal lesions were noted.  The uterus and  ovaries were palpated and noted to be within normal limits.  The patient was  noted to have a mass with a loop of small intestine adhesed to it as well as  a constrictive region circumferentially at the level of the  cecum.  In  addition, a large multilobulated mass was noted at the  base of the  mesentery.  This was in close proximity to the third portion of the  duodenum.  The right colon was mobilized along the line of Toldt.  A GIA  stapler was placed across the mid transverse colon, proximal to the middle  colic artery.  A GIA stapler was also placed on the proximal terminal ileum  and fired.  The right colon mesentery was then divided and ligated using an  LDS stapler as well as 2-0 silk ties.  The specimen was removed and sent to  pathology for further examination.  A side-to-side ileocolic anastomosis was  then performed using a GIA stapler.  The enterotomy was closed using a TA-60  stapler.  The staple line was bolstered using 3-0 silk Lembert sutures.  A  mesenteric  defect was closed using a 2-0 chromic gut running suture.  The  abdominal cavity was then copiously irrigated with normal saline.  All  surgical personnel then changed their gloves.   The bowel was returned into the abdominal cavity in an orderly fashion.  The  fascia was reapproximated using __________ Novofil running suture.  The  subcutaneous layer was irrigated with normal saline and the skin was closed  using staples.  Betadine ointment and dry sterile dressings were applied.   All tape and needle counts correct at the end of the procedure.  The patient  was extubated in the operating room and went back to the recovery room  awake, in stable condition.   COMPLICATIONS:  None.   SPECIMEN:  Right colon.   BLOOD LOSS:  150 mL.      ___________________________________________                                            Dalia Heading, M.D.   MAJ/MEDQ  D:  09/03/2003  T:  09/03/2003  Job:  [id number]   cc:   Lionel December, M.D.  [address]  [place]  [place] [address]  Fax: [phone]   Dirk Dress. Katrinka Blazing, M.D.  [address]  [place]  [place] [address]  Fax: [phone]   Ladona Horns. Neijstrom, MD  [address]  [place]   [place] [address]  Fax: [phone]

## 2010-07-11 NOTE — H&P (Signed)
NAME:  Vanessa [name], Vanessa [name] NO.:  [id number]   MEDICAL RECORD NO.:  [id number]                   PATIENT TYPE:  INP   LOCATION:  A224                                 FACILITY:  [facility]   PHYSICIAN:  Jackie Plum, M.D.             DATE OF BIRTH:  [birth year]   DATE OF ADMISSION:  08/30/2003  DATE OF DISCHARGE:                                HISTORY & PHYSICAL   PRIMARY CARE PHYSICIAN:  Jerolyn Shin C. Katrinka Blazing, M.D.   CHIEF COMPLAINT:  Generalized weakness and dizziness.   HISTORY OF PRESENT ILLNESS:  The patient is a 55 year old African-American  lady with no significant prior medical history who presented with a few days  duration of __________, easy fatigability, lightheadedness without any  vertiginous component, and palpitations.  No history of chest pain,  shortness of breath, PND, orthopnea, heat or cold intolerance, polydipsia,  polyuria, or dysuria.  She denies any history of hematuria, hematemesis and  hemoptysis.  No history of melena or bright red blood per rectum.  According  to the patient she had been having some visual problems for which she saw an  optometrist in [place].  Apparently she was told that she had some changes on  funduscopic exam consistent with possible diabetes and was referred to her  PCP, Dr. Katrinka Blazing.  The patient saw Dr. Katrinka Blazing the day before yesterday and  apparently blood draw came back indicating severe anemia with a hemoglobin  of 3.2 for which she was referred to the hospital for direct admission.   PAST MEDICAL HISTORY:  The patient denies any prior history of any  __________.  She denies any known history of diabetes mellitus.  No history  of heart disease.  She denies any prior history of constipation or bright  red blood per rectum, or melena as mentioned above.  She is postmenopausal.  She always had a light bleed when she had her menses.  She states her last  menstrual period was six years ago.  She denies any history of  uterine  fibromyomata, nor any pelvic or gynecologic problems.   FAMILY HISTORY:  Family history is negative for bowel cancer or any form of  bowel illness.  Brother had a heart attack at age 4 and died two months  ago.  No history of hypertension in the family.   SOCIAL HISTORY:  The patient is single.  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  She has  two older daughters.  She has never smoked or drunk alcohol.   REVIEW OF SYSTEMS:  Significant positives and negatives are as in the HPI,  otherwise the review of systems is unremarkable.   PHYSICAL EXAMINATION:  VITAL SIGNS:  BP 126/76, pulse rate of 110/minute,  respiratory rate of 18/minute, and temp of 98.6 degrees Fahrenheit.  GENERAL EXAMINATION:  Significant for a middle-aged African-American lady  lying in bed.  She is not in acute cardiopulmonary distress.  She is very  pale-looking.  HEENT:  Normocephalic and atraumatic.  Pupils equal, round and react to  light.  Extraocular movements are intact.  Oropharynx is moist.  She has  exquisite pallor on conjunctival examination without any icterus.  NECK:  Neck is supple.  No JVD.  No carotid bruits.  LUNG EXAMINATION:  Breath sounds, which are adequate without any crackles or  wheezes.  HEART:  Cardiac exam shows tachycardia, irregular with a grade 4/6 systolic  murmur at the left lower sternal border, which is __________.  I cannot  appreciate any S3 or S4 gallop.  ABDOMEN:  On abdominal examination she has some tenderness in her right  lower quadrant area; however, otherwise exam reveals a soft abdomen without  any significant masses.  Bowel sounds are present and normoactive.  EXTREMITIES:  On extremity exam she does not have any edema.  NEUROLOGIC EXAMINATION:  The patient is alert and oriented times three.   LABORATORY DATA:  Twelve-lead EKG shows sinus rhythm at 100 beats/minute,  left atrial enlargement without any acute ST-T wave changes.  WBC count  11.6, hemoglobin 3.1, hematocrit  11.6, MCV 51.2, and platelet count 629,000.  Sodium 135, potassium 4.1, chloride 104, CO2 25, glucose 116, BUN 7, and  creatinine 0.7.  Calcium 8.3.   IMPRESSION:  1. Critical symptomatic macrocytic anemia in a 55 year old African-American     lady.  2. Patient also has some abdominal tenderness involving her right lower     quadrant area.  I could not appreciate any obvious mass; however, there     is some dullness to percussion in the right lower quadrant area.  The exact etiology is anemia of blood loss especially with concurrent  thrombocytosis.   The patient will be admitted to a telemetry bed in view of her significant  anemia with tachycardia.  We will obtain one set of cardiac enzymes for  completeness to rule out any form of ischemia.  We will obtain iron studies  and other anemia workup.  Patient will be transfused three units of packed  red blood cells overnight and repeat hemoglobin in the morning.  In view of  her symptomatic macrocytic anemia I believe that most likely she will have  iron-deficiency anemia and she will have colonoscopy.  I may consider CT  scan of the abdomen to delineate the right lower quadrant pain and  tenderness, which is likely connected to her anemia.  Patient had a rectal  exam done by her PCP, Dr. Katrinka Blazing, and this was not repeated at this time.     ___________________________________________                                         Jackie Plum, M.D.   GO/MEDQ  D:  08/31/2003  T:  08/31/2003  Job:  [id number]   cc:   Jerolyn Shin C. Katrinka Blazing, M.D.  [address]  [place]  [place] [address]  Fax: [phone]

## 2010-07-11 NOTE — Consult Note (Signed)
NAME:  Vanessa, [name] NO.:  [id number]   MEDICAL RECORD NO.:  [id number]                   PATIENT TYPE:  INP   LOCATION:  A224                                 FACILITY:  [facility]   PHYSICIAN:  Ladona Horns. Neijstrom, MD               DATE OF BIRTH:  12-28-1955   DATE OF CONSULTATION:  08/31/2003  DATE OF DISCHARGE:                                   CONSULTATION   ONCOLOGY CONSULTATION:   DIAGNOSES:  1. Severe iron-deficiency anemia with reactive thrombocytosis.  2. Probable mass in the right lower quadrant of the abdomen.  3. History of two pregnancies and deliveries years ago.  4. History of two extraneous nipple resections at age 55 one on each side.   HISTORY:  This is a pleasant 55 year old African-American lady who was  admitted yesterday with several days' duration of fatigability and severe  lightheadedness and dizziness.  She did not have any chest pain or shortness  of breath that she admitted to.  She is not aware of craving starch but she  has craved ice lately.  She does not have a sore tongue.  She is not aware  of blood in her stool but she never looks at her stools.   She has not coughed up any blood.  She does not have any nosebleeds.   She was found to have a severe anemia with a hemoglobin of 3.2 g and was  referred for direct admission to the hospital.   FAMILY HISTORY:  Her family history is negative for bowel cancer or other  cancers.  She has a brother who had a heart attack at age 58.  She has a  sister who has had kidney failure and has had a kidney transplant.   Her mother is deceased from lung cancer.  Her father is still alive.  She  has two daughters by her same boyfriend of 35 years; they are in good  health.   SOCIAL HISTORY:  She is single, again she has never married but she has had  the same boyfriend for 35 years, does not drink alcohol, does not smoke,  never has.  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.   She states that she has lost 25 pounds over the last 2 years which she  states has been voluntary.  She states she still has a good appetite but she  has not taken time to take care of herself since she started her day care.   The only children in her day care are her grandchildren.   PHYSICAL EXAMINATION:  She is a pleasant lady.  She is afebrile, blood  pressure is 126/80, pulse is right around 88 and regular, respirations 16  and unlabored at this time.  She has no obvious lymphadenopathy in the  axilla, supraclavicular, infraclavicular, cervical or inguinal areas but  there is what  I feel is a mass in the right lower quadrant that is about 5  cm across.  It is tender.  Her bowel sounds are active.  She has no obvious  hepatosplenomegaly.  She has scars underneath both breasts from where she  has had extraneous nipples resected.  Heart shows a grade 2/6 systolic  ejection murmur without S3 gallop.  The lungs are clear.  She has no  peripheral edema.  Pulses in her feet are 1+ and symmetrical.  She is right  handed.  Her nails are not classic for iron deficiency at this time but I  believe that these nails are artificial.   Her pupils appear to be equally round and reactive to light.  She is alert  and oriented.  Her peripheral blood smear I have looked at and she has  extreme microcytosis with hypochromic red cells as well.  It is of note that  on Jun 26, 1998 she had a normal hemoglobin, normal hematocrit, and normal  MCV, her MCV here is very low at 57, her hemoglobin on admission was 3.1,  white count 11,600, platelets of 699,000 and all these platelets looked  normal as do her white cells.  Her calcium was 8.3, albumin 3.1, glucose  126, total protein 6.8, BUN and creatinine were normal.   Her liver enzymes showed a total bilirubin of 0.2 which is low.  Other liver  enzymes were normal and she is on her third unit of packed red cells.    Her ferritin level has come back as 4, B12 level is normal.   IMPRESSION:  I think her ferritin clearly confirms the appearance of her red  cells on peripheral smear but I am concerned she has a process in the bowel  which is causing blood loss and she is due for colonoscopy tomorrow.  She  has had CAT scan today and those results will be available in the near  future.      ___________________________________________                                            Ladona Horns. Mariel Sleet, MD   ESN/MEDQ  D:  08/31/2003  T:  08/31/2003  Job:  [id number]   cc:   Dirk Dress. Katrinka Blazing, M.D.  [address]  [place]  [place] [address]  Fax: [phone]   Vania Rea, M.D.   Lionel December, M.D.  [address]  [place]  [place] [address]  Fax: [phone]   R. Roetta Sessions, M.D.  [address]  [place]  [place] [address]  Fax: [phone]

## 2010-07-11 NOTE — Group Therapy Note (Signed)
NAME:  Vanessa [name], Vanessa [name] NO.:  [id number]   MEDICAL RECORD NO.:  [id number]                   PATIENT TYPE:  INP   LOCATION:  A224                                 FACILITY:  [facility]   PHYSICIAN:  Vania Rea, M.D.              DATE OF BIRTH:  1956-01-06   DATE OF PROCEDURE:  08/31/2003  DATE OF DISCHARGE:                                   PROGRESS NOTE   SUBJECTIVE:  Hospital day #2, admitted with severe anemia, hemoglobin of  3.1, has not had any recent medical follow-up.  The patient says the last  time she remembers doing blood tests was about 2000-2001 done by Dr.  Romeo Apple while he was working in Dr. Sanjuan Dame office, investigation for  arthritis.  She is not aware of any history of anemia in a family member.  She has never been told she has anemia.  This morning she says she feels  very tired because of pain medication she has received.  She has so far  received 2 units of blood and she has been evaluated by the GI service.   OBJECTIVE:  Vital signs:  Her temperature is 99.1, pulse 100, respirations  18, blood pressure 117/48.  Her mucous membranes are pale.  She is  anicteric.  Her chest is clear to auscultation bilaterally.  Abdomen:  She  seems to be tender in the right upper quadrant but she says that is from all  the poking.  She is currently drinking contrast medium in preparation for a  CT scan of the abdomen.  No new lab work is pending because she is being  transfused.  In view of her peripheral smear showing basophilic stippling we  have ordered a reticulocyte count as well as a lead level as well as a  hemoglobin electrophoresis.  Unfortunately, there is not enough baseline  blood to do these tests and they are going to be done after the patient has  had 2 units of packed cells transfused.   ASSESSMENT:  Severe anemia of unclear etiology, microcytic in nature,  probably some element of iron deficiency from chronic blood loss; although  she is hemoccult positive possibly blood loss associated with a hereditary  anemia.  Because of her leukocytosis and her thrombocytopenia, a bone marrow  problem is unlikely but she could be having an red cell aplasia.  Bone  marrow evaluation would not be inappropriate.  Will get a hematology  consult.      ___________________________________________                                            Vania Rea, M.D.   LC/MEDQ  D:  08/31/2003  T:  08/31/2003  Job:  [id number]

## 2010-07-11 NOTE — Op Note (Signed)
NAME:  Vanessa [name], Vanessa [name]           ACCOUNT NO.:  [id number]   MEDICAL RECORD NO.:  [id number]          PATIENT TYPE:  AMB   LOCATION:  DAY                           FACILITY:  [facility]   PHYSICIAN:  Lionel December, M.D.    DATE OF BIRTH:  Nov 30, 1955   DATE OF PROCEDURE:  11/09/2005  DATE OF DISCHARGE:                                 OPERATIVE REPORT   PROCEDURE:  Colonoscopy.   Vanessa [name] is a 55 year old African-American female who is undergoing  surveillance colonoscopy.  She had a right hemicolectomy in July 2005 for T3  N2 M0 lesion followed by adjuvant chemotherapy and has remained in  remission.  The procedure risks were reviewed with the patient and informed  consent was obtained.   She has periodically seen Dr. Mariel Sleet in getting her CA level checked and  these reportedly have been normal.   MEDS FOR CONSCIOUS SEDATION:  Demerol 50 mg IV, Versed 4 mg IV.   FINDINGS:  The procedure performed in endoscopy suite.  The patient's vital  signs and O2 sat were monitored during the procedure and remained stable.  The patient was placed in the left lateral decubitus position and a rectal  examination performed.  No abnormality noted on external or digital exam.  The Olympus videoscope was placed in the rectum and advanced under direct  vision into the sigmoid colon and beyond.  Preparation was excellent.  The  scope was passed into the mid transverse colon where ileocolonic anastomosis  was identified and was wide open. A short segment of TI was also examined  and was normal.  A picture was taken of the anastomosis.  As the scope was  withdrawn, the colonic mucosa was carefully examined.  There was a 3-mm  polyp at the distal sigmoid colon which was ablated via cold biopsy.  The  rest of the mucosa was normal. The rectal mucosa similarly was normal. The  scope was retroflexed to examine anorectal junction which was unremarkable.  The endoscope was straightened and withdrawn.  The patient  tolerated the  procedure well.   FINAL DIAGNOSES:  1. Patent ileocolonic anastomosis located in the region of the mid      transverse colon.  2. 3 mm polyp ablated via cold biopsy from the distal sigmoid colon.   RECOMMENDATIONS:  1. She will resume her usual medicines and diet.  2. Yearly Hemoccults.  3. She should consider next exam in 3 years from now.   I will be contacting the patient with biopsy results.   She will continue to follow with Dr. Mariel Sleet as planned.      Lionel December, M.D.  Electronically Signed     NR/MEDQ  D:  11/09/2005  T:  11/09/2005  Job:  [id number]   cc:   Ladona Horns. Mariel Sleet, MD  Fax: [phone]   Dalia Heading, M.D.  Fax: [phone]

## 2010-07-11 NOTE — H&P (Signed)
NAME:  Vanessa [name], Vanessa NO.:  [id number]   MEDICAL RECORD NO.:  [id number]                   PATIENT TYPE:  INP   LOCATION:  A308                                 FACILITY:  [facility]   PHYSICIAN:  Dalia Heading, M.D.               DATE OF BIRTH:  [birth year]   DATE OF ADMISSION:  08/30/2003  DATE OF DISCHARGE:  09/12/2003                                HISTORY & PHYSICAL   AGE:  55 years old.   CHIEF COMPLAINT:  Colon carcinoma, need for chemotherapy.   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  She is about to undergo chemotherapy and requires a Port-A-Cath to be  inserted.   PAST MEDICAL HISTORY:  As noted above.   PAST SURGICAL HISTORY:  Noted above.   CURRENT MEDICATIONS:  Dilaudid p.r.n. pain.   ALLERGIES:  No known drug allergies.   REVIEW OF SYSTEMS:  Noncontributory.   PHYSICAL EXAMINATION:  GENERAL:  The patient is a well-developed, well-  nourished, black female in no acute distress.  VITAL SIGNS:  She is afebrile and vital signs are stable.  LUNGS:  Clear to auscultation with equal breath sounds bilaterally.  HEART:  Reveals a regular rate and rhythm without S3, S4, or murmurs.   IMPRESSION:  Cecal carcinoma, need for central venous access.   PLAN:  The patient is scheduled for Port-A-Cath insertion on September 28, 2003.  The risks and benefits of the procedure including bleeding, infection, and  pneumothorax were fully explained to the patient, gave informed consent.     ___________________________________________                                         Dalia Heading, M.D.   MAJ/MEDQ  D:  09/25/2003  T:  09/25/2003  Job:  [id number]   cc:   chart   Ladona Horns. Neijstrom, MD  [address]  [place]  [place] [address]  Fax: [phone]   Dirk Dress. Katrinka Blazing, M.D.  [address]  [place]  [place] [address]  Fax: [phone]   Lionel December, M.D.  [address]  [place]  [place] [address]  Fax: [phone]

## 2010-07-11 NOTE — H&P (Signed)
NAME:  Vanessa [name], Vanessa [name]               ACCOUNT NO.:  [id number]   MEDICAL RECORD NO.:  [id number]          PATIENT TYPE:  INP   LOCATION:  A336                          FACILITY:  [facility]   PHYSICIAN:  Dalia Heading, M.D.  DATE OF BIRTH:  15-Apr-1955   DATE OF ADMISSION:  01/17/2006  DATE OF DISCHARGE:  LH                                HISTORY & PHYSICAL   CHIEF COMPLAINT:  Epigastric pain.   HISTORY OF PRESENT ILLNESS:  The patient is a 55 year old black female  status post a right hemicolectomy for adenocarcinoma of the cecum in 2005  who now presents with epigastric pain.  She had been having some bowel  movements and flatus earlier in the day yesterday.  She started having  epigastric pain, nausea, vomiting and presented to the emergency room.  A CT  scan of the abdomen and pelvis was performed which revealed mildly dilated  small bowel, question small bowel obstruction versus ileus.  She did not  tolerate the oral contrast.  She was admitted to hospital for further  evaluation and treatment.   The patient underwent a colonoscopy by Dr. Loreta Ave in September 2007 which was  unremarkable.  The anastomosis is noted to be widely patent.  She has  received chemotherapy of the past for her colon cancer through Dr.  Mariel Sleet.   PAST MEDICAL HISTORY:  Is unremarkable.   PAST SURGICAL HISTORY:  As noted above.   CURRENT MEDICATIONS:  None.   ALLERGIES:  No known drug allergies.   REVIEW OF SYSTEMS:  Noncontributory.   PHYSICAL EXAMINATION:  On physical examination, the patient is a well-  developed, well-nourished black female in no acute distress.  She is  afebrile.  Vital signs stable.  LUNGS:  Clear to auscultation with equal breath sounds bilaterally.  HEART EXAM:  Reveals regular rate and rhythm without S3, S4, murmurs.  ABDOMEN:  Soft and not significantly distended.  Well-healed lower midline  surgical scars noted.  Occasional bowel sounds are heard.  No hernias are  noted.  RECTAL:  Examination was deferred at this time  given the patient's recent  colonoscopy.   LABORATORY DATA:  CBC is within normal limits.  Chem 7 was remarkable only  for a potassium of 3.3.  Her liver profile is within normal limits.  Her  urinalysis is negative.   IMPRESSION:  Epigastric pain, less likely bowel obstruction or small-bowel  obstruction.   PLAN:  The patient is admitted to hospital for control of her pain and  nausea.  An H.  Pylori serology will be performed.  Further management is  pending those results.      Dalia Heading, M.D.  Electronically Signed     MAJ/MEDQ  D:  01/17/2006  T:  01/17/2006  Job:  [id number]

## 2010-08-14 ENCOUNTER — Other Ambulatory Visit (HOSPITAL_COMMUNITY): Payer: BC Managed Care – PPO

## 2010-09-04 ENCOUNTER — Encounter (HOSPITAL_COMMUNITY): Payer: BC Managed Care – PPO | Attending: Oncology

## 2010-09-04 DIAGNOSIS — D509 Iron deficiency anemia, unspecified: Secondary | ICD-10-CM

## 2010-09-04 DIAGNOSIS — C189 Malignant neoplasm of colon, unspecified: Secondary | ICD-10-CM

## 2010-09-04 DIAGNOSIS — E119 Type 2 diabetes mellitus without complications: Secondary | ICD-10-CM | POA: Insufficient documentation

## 2010-09-04 DIAGNOSIS — I10 Essential (primary) hypertension: Secondary | ICD-10-CM

## 2010-09-04 DIAGNOSIS — Z452 Encounter for adjustment and management of vascular access device: Secondary | ICD-10-CM

## 2010-09-04 LAB — COMPREHENSIVE METABOLIC PANEL
Albumin: 3.5 g/dL (ref 3.5–5.2)
BUN: 15 mg/dL (ref 6–23)
Calcium: 8.8 mg/dL (ref 8.4–10.5)
GFR calc Af Amer: >60 mL/min (ref >60)
Glucose, Bld: 168 mg/dL — ABNORMAL HIGH (ref 70–99)
Sodium: 140 mEq/L (ref 135–145)
Total Protein: 8 g/dL (ref 6.0–8.3)

## 2010-09-04 LAB — CEA: CEA: 1 ng/mL (ref 0.0–5.0)

## 2010-09-04 LAB — CBC
HCT: 39.7 % (ref 36.0–46.0)
Hemoglobin: 12.7 g/dL (ref 12.0–15.0)
MCH: 26.3 pg (ref 26.0–34.0)
MCHC: 32 g/dL (ref 30.0–36.0)
RDW: 15 % (ref 11.5–15.5)

## 2010-09-04 MED ORDER — HEPARIN SOD (PORK) LOCK FLUSH 100 UNIT/ML IV SOLN
INTRAVENOUS | Status: AC
  Administered 2010-09-04: 500 [IU] via INTRAVENOUS
  Filled 2010-09-04: qty 5

## 2010-09-04 MED ORDER — SODIUM CHLORIDE 0.9 % IJ SOLN
INTRAMUSCULAR | Status: AC
  Administered 2010-09-04: 20 mL via INTRAVENOUS
  Filled 2010-09-04: qty 20

## 2010-09-04 NOTE — Progress Notes (Signed)
Vanessa [name] presented for labwork. Labs per MD order drawn via Portacath located  left chest wall accessed with  H 20 needle. Good blood return present. Procedure without incident.  Portacath flushed with 20ml NS and 500U/54ml Heparin and needle removed intact. Patient tolerated procedure well.

## 2010-09-05 ENCOUNTER — Other Ambulatory Visit (HOSPITAL_COMMUNITY): Payer: Self-pay | Admitting: Oncology

## 2010-09-09 ENCOUNTER — Ambulatory Visit (HOSPITAL_COMMUNITY)
Admission: RE | Admit: 2010-09-09 | Discharge: 2010-09-09 | Disposition: A | Discharge status: Home / Self Care | Payer: BC Managed Care – PPO | Source: Ambulatory Visit | Attending: Oncology | Admitting: Oncology

## 2010-09-09 ENCOUNTER — Encounter (HOSPITAL_BASED_OUTPATIENT_CLINIC_OR_DEPARTMENT_OTHER): Payer: BC Managed Care – PPO | Admitting: Oncology

## 2010-09-09 ENCOUNTER — Encounter (HOSPITAL_COMMUNITY): Payer: Self-pay | Admitting: Oncology

## 2010-09-09 ENCOUNTER — Other Ambulatory Visit (HOSPITAL_COMMUNITY): Payer: Self-pay | Admitting: Oncology

## 2010-09-09 DIAGNOSIS — C189 Malignant neoplasm of colon, unspecified: Secondary | ICD-10-CM

## 2010-09-09 DIAGNOSIS — Z139 Encounter for screening, unspecified: Secondary | ICD-10-CM

## 2010-09-09 DIAGNOSIS — E119 Type 2 diabetes mellitus without complications: Secondary | ICD-10-CM

## 2010-09-09 DIAGNOSIS — Z1231 Encounter for screening mammogram for malignant neoplasm of breast: Secondary | ICD-10-CM | POA: Insufficient documentation

## 2010-09-09 LAB — HEMOGLOBIN A1C: Mean Plasma Glucose: 131 mg/dL — ABNORMAL HIGH (ref <117)

## 2010-09-09 NOTE — Progress Notes (Signed)
CC:   Vanessa [name], M.D. Dalia Heading, M.D. Lionel December, M.D.  DIAGNOSES: 1. Stage III adenocarcinoma of the colon, presenting with an 8.4-cm     cecal primary and 5 of 14 positive nodes and a lung nodule that     turned out not to be metastatic disease, but we could not exclude     it at that time.  She was operated on with a right hemicolectomy on     09/01/2003 and treated with oxaliplatin, capecitabine, and Avastin     for 6 cycles.  Thus far has had no recurrence. 2. Hyperglycemia, consistent with diabetes mellitus, though she does     not have a primary care physician. 3. Iron deficiency anemia at the time of presentation, which has     resolved. 4. Hypertension, on Norvasc with good control of her blood pressure,     though she has the white coat syndrome. 5. Mild obesity, weighing 163 pounds.  We will get a height later     today; that has not been entered into the computer.  She is doing well with no nausea, no vomiting.  She has some left lower quadrant discomfort, which Dr. Christin Bach is working up and it may be from a fibroid that she has.  Her surgery, of course was on the right side of her abdomen with a cecal lesion.  The pain has been worse for about a month and sort of aching down in the left inguinal/left suprapubic area at best.  She points it out on her physical exam.  She has not had any fevers, chills.  No frequency of urination that she admits to.  She has not been checking her sugars lately, even though she has the capacity to do that at home.  She was unsuccessful in getting a primary care doctor and has really never had one, she states.  She sort of stopped trying after she was unable to get one last year.  She has had no numbness, no tingling of her fingers, toes.  Her appetite is excellent and in going over her dietary history, she eats a tremendous amount of sugar-filled fluids and foods.  She still lives with her husband.  He is a  [employer] architect, on the road most days, she states.  PHYSICAL EXAMINATION:  Vital Signs:  Her weight is 163.8 pounds, blood pressure 158/89, she is afebrile, pulse 76 to 78 and regular, respirations 16 to 18 and unlabored.  Lymph Nodes:  Negative throughout, including cervical, supraclavicular, infraclavicular, axillary, or inguinal areas.  Neck:  I did not detect thyromegaly.  Lungs:  Clear to auscultation and percussion.  Heart:  Shows regular rhythm and rate without murmur, rub, or gallop.  Breasts:  Both breasts are negative for any masses.  Abdomen:  Soft, nontender at this time.  She has normal to slightly decreased bowel sounds.  She has no hepatosplenomegaly.  Scars are well healed.  Extremities:  She has no peripheral edema of the arms or legs at this time.  Pulses are intact in the feet, 1+ to 2+ and symmetrical.  Her nails especially on the left hand remain sometimes over an inch and a half long.  Jasmine December, I think has developed diabetes I suspect with her sugars, most recently being around 168 to 187.  I looked back on my labs from even a couple of years ago and her sugars were high, but again she was not successful in getting a primary care  doctor, so right now we will try to work on getting her one and we will get a diabetic dietary consultation so she can understand what to eat and what not to eat, and I think we will see where her hemoglobin A1c is and if we have to we will add glipizide or some oral agent.  In the meantime, she is out 7 years from her colon cancer, which was a stage III and was not a stage IV, thank goodness.  She has had no evidence of recurrence.  CEA we will do every 6 months for another year. In a year, we will check a CMET, CBC, diff, as well as a CEA.  Right now I do not think we need to do a CT of the abdomen and pelvis. She is up-to-date with her colonoscopies per Dr. Karilyn Cota, had one in August 2011, will have another one in 2014.  We will see  her back sooner if need be, but I think she has done very well overall.    ______________________________ Ladona Horns. Mariel Sleet, MD ESN/MEDQ  D:  09/09/2010  T:  09/09/2010  Job:  [id number]

## 2010-09-09 NOTE — Progress Notes (Signed)
This office note has been dictated.

## 2010-09-09 NOTE — Patient Instructions (Signed)
[facility]  Discharge Instructions  RECOMMENDATIONS MADE BY THE CONSULTANT AND ANY TEST RESULTS WILL BE SENT TO YOUR REFERRING DOCTOR.   EXAM FINDINGS BY MD TODAY AND SIGNS AND SYMPTOMS TO REPORT TO CLINIC OR PRIMARY MD:  You need to try to loose weight and get your blood sugars down  MEDICATIONS PRESCRIBED: none   INSTRUCTIONS GIVEN AND DISCUSSED: Other  Diabetic consult  SPECIAL INSTRUCTIONS/FOLLOW-UP: Follow-up with Family Doctor: The new dr. At [facility] is taking new pts. Their # is [phone].  and Other (Referral/Appointments)  Diabetic consult. Schedule your mammogram   I acknowledge that I have been informed and understand all the instructions given to me and received a copy. I do not have any more questions at this time, but understand that I may call the Specialty Clinic at [facility] at [phone] during business hours should I have any further questions or need assistance in obtaining follow-up care.    __________________________________________  _____________  __________ Signature of Patient or Authorized Representative            Date                   Time    __________________________________________ Nurse's Signature

## 2010-09-24 ENCOUNTER — Other Ambulatory Visit: Payer: Self-pay | Admitting: Obstetrics and Gynecology

## 2010-09-24 ENCOUNTER — Other Ambulatory Visit (HOSPITAL_COMMUNITY)
Admission: RE | Admit: 2010-09-24 | Discharge: 2010-09-24 | Disposition: A | Discharge status: Home / Self Care | Payer: BC Managed Care – PPO | Source: Ambulatory Visit | Attending: Obstetrics and Gynecology | Admitting: Obstetrics and Gynecology

## 2010-09-24 DIAGNOSIS — Z01419 Encounter for gynecological examination (general) (routine) without abnormal findings: Secondary | ICD-10-CM | POA: Insufficient documentation

## 2010-09-30 ENCOUNTER — Other Ambulatory Visit: Payer: Self-pay | Admitting: Obstetrics and Gynecology

## 2010-10-13 ENCOUNTER — Telehealth (HOSPITAL_COMMUNITY): Payer: Self-pay | Admitting: *Deleted

## 2010-10-13 NOTE — Telephone Encounter (Signed)
Patient to have upcoming surgery before Thanksgiving regarding fibroids in uterus. She is going to have her uterus removed by Dr. Emelda Fear. She is wondering about having her ovaries removed too? She would prefer to keep her ovaries but what are your thoughts regarding future cancers, etc.? Patient has already gone through menopause.

## 2010-10-14 NOTE — Telephone Encounter (Signed)
I would just remove her ovaries since they are not helping her anymore since she went through menopause-the cancer risk is minimal but not related to her colon ca.

## 2010-10-16 ENCOUNTER — Encounter (HOSPITAL_COMMUNITY): Payer: BC Managed Care – PPO | Attending: Oncology

## 2010-10-16 ENCOUNTER — Encounter (HOSPITAL_COMMUNITY): Payer: BC Managed Care – PPO

## 2010-10-16 DIAGNOSIS — E119 Type 2 diabetes mellitus without complications: Secondary | ICD-10-CM | POA: Insufficient documentation

## 2010-10-16 DIAGNOSIS — Z452 Encounter for adjustment and management of vascular access device: Secondary | ICD-10-CM

## 2010-10-16 DIAGNOSIS — C189 Malignant neoplasm of colon, unspecified: Secondary | ICD-10-CM | POA: Insufficient documentation

## 2010-10-16 DIAGNOSIS — D509 Iron deficiency anemia, unspecified: Secondary | ICD-10-CM | POA: Insufficient documentation

## 2010-10-16 DIAGNOSIS — I10 Essential (primary) hypertension: Secondary | ICD-10-CM | POA: Insufficient documentation

## 2010-10-16 MED ORDER — HEPARIN SOD (PORK) LOCK FLUSH 100 UNIT/ML IV SOLN
INTRAVENOUS | Status: AC
  Administered 2010-10-16: 500 [IU] via INTRAVENOUS
  Filled 2010-10-16: qty 5

## 2010-10-16 MED ORDER — HEPARIN SOD (PORK) LOCK FLUSH 100 UNIT/ML IV SOLN
500.0000 [IU] | Freq: Once | INTRAVENOUS | Status: AC
  Administered 2010-10-16: 500 [IU] via INTRAVENOUS

## 2010-10-16 MED ORDER — SODIUM CHLORIDE 0.9 % IJ SOLN
10.0000 mL | Freq: Once | INTRAMUSCULAR | Status: AC
  Administered 2010-10-16: 10 mL via INTRAVENOUS

## 2010-10-16 NOTE — Progress Notes (Signed)
Vanessa [name] presented for Portacath access and flush. Proper placement of portacath confirmed by CXR. Portacath located lt chest wall accessed with  H 20 needle. Good blood return present. Portacath flushed with 20ml NS and 500U/23ml Heparin and needle removed intact. Procedure without incident. Patient tolerated procedure well.

## 2010-11-17 LAB — CEA: CEA: 0.7

## 2010-11-19 LAB — COMPREHENSIVE METABOLIC PANEL
ALT: 24
AST: 27
Albumin: 3.3 — ABNORMAL LOW
Calcium: 8.9
GFR calc Af Amer: >60
Sodium: 139
Total Protein: 7

## 2010-11-19 LAB — CBC
Platelets: 225
RDW: 14.9

## 2010-11-19 LAB — DIFFERENTIAL
Eosinophils Absolute: 0.1
Lymphs Abs: 1.7
Monocytes Relative: 9
Neutrophils Relative %: 57

## 2010-11-19 LAB — CEA: CEA: 0.9

## 2010-11-26 LAB — DIFFERENTIAL
Basophils Relative: 1
Lymphs Abs: 2
Monocytes Relative: 10
Neutro Abs: 3.3
Neutrophils Relative %: 54

## 2010-11-26 LAB — COMPREHENSIVE METABOLIC PANEL
BUN: 12
Calcium: 9.2
Glucose, Bld: 98
Total Protein: 7.3

## 2010-11-26 LAB — CBC
MCHC: 33.6
MCV: 82.3
RBC: 4.69

## 2010-11-27 ENCOUNTER — Other Ambulatory Visit (HOSPITAL_COMMUNITY): Payer: BC Managed Care – PPO

## 2010-11-28 LAB — CEA: CEA: 0.8

## 2010-11-28 LAB — CBC
HCT: 37.4
MCV: 83
RBC: 4.5
WBC: 5.3

## 2010-12-05 LAB — DIFFERENTIAL
Eosinophils Relative: 0
Lymphocytes Relative: 23
Lymphs Abs: 1.3
Monocytes Absolute: 0.5
Monocytes Relative: 8

## 2010-12-05 LAB — CBC
HCT: 40.8
Hemoglobin: 13.6
RBC: 4.95
RDW: 14.3 — ABNORMAL HIGH

## 2010-12-05 LAB — BASIC METABOLIC PANEL
GFR calc Af Amer: >60
GFR calc non Af Amer: 56 — ABNORMAL LOW
Glucose, Bld: 97
Potassium: 4.1
Sodium: 137

## 2010-12-09 LAB — DIFFERENTIAL
Basophils Absolute: 0.1
Basophils Relative: 2 — ABNORMAL HIGH
Eosinophils Relative: 3
Lymphocytes Relative: 27
Monocytes Absolute: 0.4
Neutro Abs: 3.4

## 2010-12-09 LAB — CBC
HCT: 36.1
Hemoglobin: 12.3
MCHC: 34.1
MCV: 91.6
RBC: 3.94
WBC: 5.5

## 2010-12-09 LAB — COMPREHENSIVE METABOLIC PANEL
Alkaline Phosphatase: 81
BUN: 12
CO2: 28
Chloride: 103
Creatinine, Ser: 0.72
GFR calc non Af Amer: >60
Potassium: 3.8
Total Bilirubin: 0.6

## 2010-12-09 LAB — FERRITIN: Ferritin: 37 (ref 10–291)

## 2010-12-11 ENCOUNTER — Encounter (HOSPITAL_COMMUNITY): Payer: BC Managed Care – PPO | Attending: Oncology

## 2010-12-11 DIAGNOSIS — C189 Malignant neoplasm of colon, unspecified: Secondary | ICD-10-CM | POA: Insufficient documentation

## 2010-12-11 MED ORDER — SODIUM CHLORIDE 0.9 % IJ SOLN
INTRAMUSCULAR | Status: AC
  Filled 2010-12-11: qty 10

## 2010-12-11 MED ORDER — HEPARIN SOD (PORK) LOCK FLUSH 100 UNIT/ML IV SOLN
INTRAVENOUS | Status: AC
  Filled 2010-12-11: qty 5

## 2010-12-11 NOTE — Progress Notes (Signed)
Vanessa [name] presented for Portacath access and flush. Proper placement of portacath confirmed by CXR. Portacath located left chest wall accessed with  H 20 needle. Good blood return present.  Specimen drawn for CEA.   Portacath flushed with 20ml NS and 500U/43ml Heparin and needle removed intact. Procedure without incident. Patient tolerated procedure well.

## 2011-03-11 ENCOUNTER — Telehealth (HOSPITAL_COMMUNITY): Payer: Self-pay | Admitting: *Deleted

## 2011-03-12 ENCOUNTER — Encounter (HOSPITAL_COMMUNITY): Payer: BC Managed Care – PPO | Attending: Oncology

## 2011-03-12 DIAGNOSIS — C189 Malignant neoplasm of colon, unspecified: Secondary | ICD-10-CM | POA: Insufficient documentation

## 2011-03-12 LAB — CBC
MCH: 26.1 pg (ref 26.0–34.0)
MCV: 82.2 fL (ref 78.0–100.0)
Platelets: 243 10*3/uL (ref 150–400)
RBC: 4.99 MIL/uL (ref 3.87–5.11)
RDW: 14.8 % (ref 11.5–15.5)
WBC: 4.3 10*3/uL (ref 4.0–10.5)

## 2011-03-12 LAB — DIFFERENTIAL
Basophils Absolute: 0 10*3/uL (ref 0.0–0.1)
Eosinophils Absolute: 0.1 10*3/uL (ref 0.0–0.7)
Eosinophils Relative: 1 % (ref 0–5)
Lymphs Abs: 1.9 10*3/uL (ref 0.7–4.0)
Neutrophils Relative %: 45 % (ref 43–77)

## 2011-03-12 LAB — COMPREHENSIVE METABOLIC PANEL
ALT: 22 U/L (ref 0–35)
AST: 23 U/L (ref 0–37)
Albumin: 3.8 g/dL (ref 3.5–5.2)
Alkaline Phosphatase: 100 U/L (ref 39–117)
Calcium: 9.5 mg/dL (ref 8.4–10.5)
Potassium: 3.8 mEq/L (ref 3.5–5.1)
Sodium: 140 mEq/L (ref 135–145)
Total Protein: 7.8 g/dL (ref 6.0–8.3)

## 2011-03-12 MED ORDER — SODIUM CHLORIDE 0.9 % IJ SOLN
10.0000 mL | INTRAMUSCULAR | Status: DC | PRN
  Administered 2011-03-12: 10 mL via INTRAVENOUS
  Filled 2011-03-12: qty 10

## 2011-03-12 MED ORDER — HEPARIN SOD (PORK) LOCK FLUSH 100 UNIT/ML IV SOLN
500.0000 [IU] | Freq: Once | INTRAVENOUS | Status: AC
  Administered 2011-03-12: 500 [IU] via INTRAVENOUS
  Filled 2011-03-12: qty 5

## 2011-03-12 MED ORDER — SODIUM CHLORIDE 0.9 % IJ SOLN
INTRAMUSCULAR | Status: AC
  Administered 2011-03-12: 10 mL via INTRAVENOUS
  Filled 2011-03-12: qty 10

## 2011-03-12 MED ORDER — HEPARIN SOD (PORK) LOCK FLUSH 100 UNIT/ML IV SOLN
INTRAVENOUS | Status: AC
  Administered 2011-03-12: 500 [IU] via INTRAVENOUS
  Filled 2011-03-12: qty 5

## 2011-03-12 NOTE — Progress Notes (Signed)
Vanessa [name] presented for Portacath access and flush. Proper placement of portacath confirmed by CXR. Portacath located left chest wall accessed with  H 20 needle. Good blood return present.  10 cc blood wasted; 10 cc blood obtained for testing per MD order. Portacath flushed with 20ml NS and 500U/70ml Heparin and needle removed intact. Procedure without incident. Patient tolerated procedure well.

## 2011-07-09 ENCOUNTER — Other Ambulatory Visit (HOSPITAL_COMMUNITY): Payer: Self-pay | Admitting: Oncology

## 2011-07-09 DIAGNOSIS — I10 Essential (primary) hypertension: Secondary | ICD-10-CM

## 2011-07-09 MED ORDER — AMLODIPINE BESYLATE 10 MG PO TABS: 10.0000 mg | ORAL_TABLET | Freq: Every day | ORAL | Status: DC

## 2011-09-02 ENCOUNTER — Other Ambulatory Visit (HOSPITAL_COMMUNITY): Payer: Self-pay | Admitting: Oncology

## 2011-09-02 DIAGNOSIS — Z139 Encounter for screening, unspecified: Secondary | ICD-10-CM

## 2011-09-08 ENCOUNTER — Other Ambulatory Visit (HOSPITAL_COMMUNITY): Payer: Self-pay | Admitting: Oncology

## 2011-09-09 ENCOUNTER — Other Ambulatory Visit (HOSPITAL_COMMUNITY): Payer: BC Managed Care – PPO

## 2011-09-09 ENCOUNTER — Ambulatory Visit (HOSPITAL_COMMUNITY): Payer: BC Managed Care – PPO | Admitting: Oncology

## 2011-09-14 ENCOUNTER — Ambulatory Visit (HOSPITAL_COMMUNITY): Payer: BC Managed Care – PPO

## 2011-09-14 ENCOUNTER — Ambulatory Visit (HOSPITAL_COMMUNITY)
Admission: RE | Admit: 2011-09-14 | Discharge: 2011-09-14 | Disposition: A | Discharge status: Home / Self Care | Payer: BC Managed Care – PPO | Source: Ambulatory Visit | Attending: Oncology | Admitting: Oncology

## 2011-09-14 DIAGNOSIS — Z1231 Encounter for screening mammogram for malignant neoplasm of breast: Secondary | ICD-10-CM | POA: Insufficient documentation

## 2011-09-14 DIAGNOSIS — Z139 Encounter for screening, unspecified: Secondary | ICD-10-CM

## 2011-09-22 ENCOUNTER — Other Ambulatory Visit (HOSPITAL_COMMUNITY): Payer: BC Managed Care – PPO

## 2011-09-22 ENCOUNTER — Ambulatory Visit (HOSPITAL_COMMUNITY): Payer: BC Managed Care – PPO | Admitting: Oncology

## 2011-09-23 ENCOUNTER — Encounter (HOSPITAL_COMMUNITY): Payer: BC Managed Care – PPO | Attending: Oncology

## 2011-09-23 DIAGNOSIS — E119 Type 2 diabetes mellitus without complications: Secondary | ICD-10-CM | POA: Insufficient documentation

## 2011-09-23 DIAGNOSIS — Z452 Encounter for adjustment and management of vascular access device: Secondary | ICD-10-CM | POA: Insufficient documentation

## 2011-09-23 DIAGNOSIS — Z95828 Presence of other vascular implants and grafts: Secondary | ICD-10-CM

## 2011-09-23 LAB — CBC
HCT: 38.9 % (ref 36.0–46.0)
MCV: 81.9 fL (ref 78.0–100.0)
Platelets: 236 10*3/uL (ref 150–400)
RBC: 4.75 MIL/uL (ref 3.87–5.11)
WBC: 4.6 10*3/uL (ref 4.0–10.5)

## 2011-09-23 LAB — COMPREHENSIVE METABOLIC PANEL
AST: 22 U/L (ref 0–37)
Alkaline Phosphatase: 82 U/L (ref 39–117)
CO2: 28 mEq/L (ref 19–32)
Chloride: 101 mEq/L (ref 96–112)
Creatinine, Ser: 0.77 mg/dL (ref 0.50–1.10)
GFR calc non Af Amer: >90 mL/min (ref >90)
Total Bilirubin: 0.4 mg/dL (ref 0.3–1.2)

## 2011-09-23 MED ORDER — SODIUM CHLORIDE 0.9 % IJ SOLN
10.0000 mL | INTRAMUSCULAR | Status: DC | PRN
  Filled 2011-09-23: qty 10

## 2011-09-23 MED ORDER — HEPARIN SOD (PORK) LOCK FLUSH 100 UNIT/ML IV SOLN
INTRAVENOUS | Status: AC
  Filled 2011-09-23: qty 5

## 2011-09-23 MED ORDER — HEPARIN SOD (PORK) LOCK FLUSH 100 UNIT/ML IV SOLN
500.0000 [IU] | Freq: Once | INTRAVENOUS | Status: DC
  Filled 2011-09-23: qty 5

## 2011-09-23 NOTE — Progress Notes (Signed)
Vanessa [name] presented for Portacath access and flush. Proper placement of portacath confirmed by CXR. Portacath located lt chest wall accessed with  H 20 needle. Good blood return present. Portacath flushed with 20ml NS and 500U/5ml Heparin and needle removed intact. Procedure without incident. Patient tolerated procedure well.   

## 2011-09-24 ENCOUNTER — Telehealth (HOSPITAL_COMMUNITY): Payer: Self-pay

## 2011-09-24 NOTE — Telephone Encounter (Signed)
Patient called to inform of all lab results.  Patient verbalized understanding and was very pleased.

## 2011-10-23 ENCOUNTER — Encounter (HOSPITAL_COMMUNITY): Payer: Self-pay | Admitting: Oncology

## 2011-10-23 ENCOUNTER — Encounter (HOSPITAL_COMMUNITY): Payer: BC Managed Care – PPO | Attending: Oncology | Admitting: Oncology

## 2011-10-23 VITALS — BP 169/93 | HR 79 | Temp 98.0°F | Resp 20 | Wt 160.9 lb

## 2011-10-23 DIAGNOSIS — M1990 Unspecified osteoarthritis, unspecified site: Secondary | ICD-10-CM

## 2011-10-23 DIAGNOSIS — M129 Arthropathy, unspecified: Secondary | ICD-10-CM

## 2011-10-23 DIAGNOSIS — C189 Malignant neoplasm of colon, unspecified: Secondary | ICD-10-CM | POA: Insufficient documentation

## 2011-10-23 LAB — SEDIMENTATION RATE: Sed Rate: 42 mm/hr — ABNORMAL HIGH (ref 0–22)

## 2011-10-23 NOTE — Progress Notes (Signed)
Problem number 1 stage III adenocarcinoma of colon presenting with a 0.4 cm cecal cancer and 5 of 14 positive nodes and a lung nodule it turned out not to be metastatic disease but we could not exclude it at that time. She had a right hemicolectomy on 09/01/2003 and then we treated her with oxaliplatinum, capecitabine, and Avastin for 6 cycles and thus far she is showing no evidence recurrence. She is out 8 years from presentation and surgery. Problem #2 mild hyperglycemia Problem #3 iron deficiency anemia at the time of presentation problem #4 hypertension on therapy Problem #5 mild excessive weight weight 160 pounds a 5 foot 1 inch frame. Problem #6 anxiety and mild depression secondary to her husband's illness which consists of a brain tumor. It has been very complicated by infection postoperatively.   Vanessa [name] has had a tough time trying to help her husband get better. She's giving him back in my every 8 hours now. He may have an infection within the brain versus worsening of disease. That is being evaluated by his neurosurgeon  She is concerned about her right flank still has a little back discomfort which I think is just muscular and she is worried that her cancer may be coming back. She has not really lost weight bowel function is fine. She would like to have her Port-A-Cath out but may lose her insurance.  Vital signs are stable lymph node exam is negative throughout I think she does have a little spasticity of the lumbar musculature more the right than the left. Abdomen is soft little tender near the scar but this is not new or different. Bowel sounds are normal. She has no hepatosplenomegaly. She has no peripheral edema. She is also complaining of arthritis-like pains in her wrists and ankles but they are minimally puffy but without heat, redness and no tenderness presently. Breast exam is negative for masses. Heart shows a regular rhythm and rate without murmur rub or gallop her lungs are  perfectly clear. She has no skin lesions that are worrisome.  We will check some blood work in because were out 8 years and may remove the Port-A-Cath will get a CAT scan of her abdomen. I will see her in a year if all is well. I do think she has degenerative arthritis rather than rheumatoid but she is concerned about this

## 2011-10-23 NOTE — Patient Instructions (Signed)
[facility]  Discharge Instructions  RECOMMENDATIONS MADE BY THE CONSULTANT AND ANY TEST RESULTS WILL BE SENT TO YOUR REFERRING DOCTOR.   Lab work today. We will call you next week with those results. We will get you scheduled for a CT scan sometime next week. We will get you an appointment with Dr.Jenkins ASAP to get your port out.   I acknowledge that I have been informed and understand all the instructions given to me and received a copy. I do not have any more questions at this time, but understand that I may call the Specialty Clinic at [facility] at [phone] during business hours should I have any further questions or need assistance in obtaining follow-up care.    __________________________________________  _____________  __________ Signature of Patient or Authorized Representative            Date                   Time    __________________________________________ Nurse's Signature

## 2011-10-27 ENCOUNTER — Ambulatory Visit (HOSPITAL_COMMUNITY)
Admission: RE | Admit: 2011-10-27 | Discharge: 2011-10-27 | Disposition: A | Discharge status: Home / Self Care | Payer: BC Managed Care – PPO | Source: Ambulatory Visit | Attending: Oncology | Admitting: Oncology

## 2011-10-27 ENCOUNTER — Encounter (HOSPITAL_COMMUNITY): Payer: Self-pay

## 2011-10-27 DIAGNOSIS — R1031 Right lower quadrant pain: Secondary | ICD-10-CM | POA: Insufficient documentation

## 2011-10-27 DIAGNOSIS — Q619 Cystic kidney disease, unspecified: Secondary | ICD-10-CM | POA: Insufficient documentation

## 2011-10-27 DIAGNOSIS — Z85038 Personal history of other malignant neoplasm of large intestine: Secondary | ICD-10-CM | POA: Insufficient documentation

## 2011-10-27 DIAGNOSIS — C189 Malignant neoplasm of colon, unspecified: Secondary | ICD-10-CM

## 2011-10-27 MED ORDER — IOHEXOL 300 MG/ML  SOLN
100.0000 mL | Freq: Once | INTRAMUSCULAR | Status: AC | PRN
  Administered 2011-10-27: 100 mL via INTRAVENOUS

## 2011-10-28 ENCOUNTER — Telehealth (HOSPITAL_COMMUNITY): Payer: Self-pay | Admitting: *Deleted

## 2011-10-28 NOTE — Telephone Encounter (Signed)
Refills called in on potassium and lisinopril to [facility]

## 2011-10-28 NOTE — Telephone Encounter (Signed)
Skyley wants to know if you will call in Lisinopril hct 10-12.5 mg one a day to [facility]

## 2011-10-30 ENCOUNTER — Other Ambulatory Visit (HOSPITAL_COMMUNITY): Payer: Self-pay | Admitting: Oncology

## 2011-11-03 ENCOUNTER — Encounter (HOSPITAL_COMMUNITY): Payer: BC Managed Care – PPO | Attending: Oncology

## 2011-11-03 DIAGNOSIS — Z452 Encounter for adjustment and management of vascular access device: Secondary | ICD-10-CM

## 2011-11-03 DIAGNOSIS — C189 Malignant neoplasm of colon, unspecified: Secondary | ICD-10-CM

## 2011-11-03 LAB — CBC WITH DIFFERENTIAL/PLATELET
Eosinophils Absolute: 0.1 10*3/uL (ref 0.0–0.7)
Eosinophils Relative: 2 % (ref 0–5)
HCT: 39.9 % (ref 36.0–46.0)
Hemoglobin: 12.9 g/dL (ref 12.0–15.0)
Lymphs Abs: 1.9 10*3/uL (ref 0.7–4.0)
MCH: 26.7 pg (ref 26.0–34.0)
MCV: 82.4 fL (ref 78.0–100.0)
Monocytes Absolute: 0.5 10*3/uL (ref 0.1–1.0)
Monocytes Relative: 9 % (ref 3–12)
Platelets: 241 10*3/uL (ref 150–400)
RBC: 4.84 MIL/uL (ref 3.87–5.11)

## 2011-11-03 LAB — COMPREHENSIVE METABOLIC PANEL
BUN: 13 mg/dL (ref 6–23)
Calcium: 9.4 mg/dL (ref 8.4–10.5)
GFR calc Af Amer: >90 mL/min (ref >90)
GFR calc non Af Amer: >90 mL/min (ref >90)
Glucose, Bld: 110 mg/dL — ABNORMAL HIGH (ref 70–99)
Total Protein: 7.6 g/dL (ref 6.0–8.3)

## 2011-11-03 MED ORDER — SODIUM CHLORIDE 0.9 % IJ SOLN
INTRAMUSCULAR | Status: AC
  Filled 2011-11-03: qty 10

## 2011-11-03 MED ORDER — HEPARIN SOD (PORK) LOCK FLUSH 100 UNIT/ML IV SOLN
500.0000 [IU] | Freq: Once | INTRAVENOUS | Status: AC
  Administered 2011-11-03: 500 [IU] via INTRAVENOUS
  Filled 2011-11-03: qty 5

## 2011-11-03 MED ORDER — HEPARIN SOD (PORK) LOCK FLUSH 100 UNIT/ML IV SOLN
INTRAVENOUS | Status: AC
  Filled 2011-11-03: qty 5

## 2011-11-03 MED ORDER — SODIUM CHLORIDE 0.9 % IJ SOLN
10.0000 mL | INTRAMUSCULAR | Status: DC | PRN
  Administered 2011-11-03: 10 mL via INTRAVENOUS
  Filled 2011-11-03: qty 10

## 2011-11-03 NOTE — Progress Notes (Signed)
Shekia W Irving presented for Portacath access and flush. Proper placement of portacath confirmed by CXR. Portacath located lt chest wall accessed with  H 20 needle. Good blood return present. Portacath flushed with 20ml NS and 500U/5ml Heparin and needle removed intact. Procedure without incident. Patient tolerated procedure well.   

## 2011-11-04 ENCOUNTER — Other Ambulatory Visit (HOSPITAL_COMMUNITY): Payer: BC Managed Care – PPO

## 2011-11-04 LAB — CEA: CEA: 1.4 ng/mL (ref 0.0–5.0)

## 2012-08-15 ENCOUNTER — Other Ambulatory Visit (HOSPITAL_COMMUNITY): Payer: Self-pay

## 2012-08-15 DIAGNOSIS — C189 Malignant neoplasm of colon, unspecified: Secondary | ICD-10-CM

## 2012-10-05 ENCOUNTER — Other Ambulatory Visit (HOSPITAL_COMMUNITY): Payer: Self-pay | Admitting: Oncology

## 2012-10-05 DIAGNOSIS — Z139 Encounter for screening, unspecified: Secondary | ICD-10-CM

## 2012-10-07 ENCOUNTER — Ambulatory Visit (HOSPITAL_COMMUNITY): Payer: BC Managed Care – PPO

## 2012-10-10 ENCOUNTER — Ambulatory Visit (HOSPITAL_COMMUNITY)
Admission: RE | Admit: 2012-10-10 | Discharge: 2012-10-10 | Disposition: A | Discharge status: Home / Self Care | Payer: BC Managed Care – PPO | Source: Ambulatory Visit | Attending: Oncology | Admitting: Oncology

## 2012-10-10 DIAGNOSIS — Z139 Encounter for screening, unspecified: Secondary | ICD-10-CM

## 2012-10-10 DIAGNOSIS — Z1231 Encounter for screening mammogram for malignant neoplasm of breast: Secondary | ICD-10-CM | POA: Insufficient documentation

## 2012-10-18 ENCOUNTER — Encounter (INDEPENDENT_AMBULATORY_CARE_PROVIDER_SITE_OTHER): Payer: Self-pay | Admitting: *Deleted

## 2012-10-19 ENCOUNTER — Encounter (INDEPENDENT_AMBULATORY_CARE_PROVIDER_SITE_OTHER): Payer: Self-pay

## 2012-10-20 ENCOUNTER — Encounter (HOSPITAL_COMMUNITY): Payer: BC Managed Care – PPO | Attending: Hematology and Oncology

## 2012-10-20 DIAGNOSIS — C189 Malignant neoplasm of colon, unspecified: Secondary | ICD-10-CM

## 2012-10-20 DIAGNOSIS — Z452 Encounter for adjustment and management of vascular access device: Secondary | ICD-10-CM

## 2012-10-20 DIAGNOSIS — Z85038 Personal history of other malignant neoplasm of large intestine: Secondary | ICD-10-CM | POA: Insufficient documentation

## 2012-10-20 DIAGNOSIS — Z09 Encounter for follow-up examination after completed treatment for conditions other than malignant neoplasm: Secondary | ICD-10-CM | POA: Insufficient documentation

## 2012-10-20 LAB — CBC WITH DIFFERENTIAL/PLATELET
Basophils Relative: 0 % (ref 0–1)
Hemoglobin: 13 g/dL (ref 12.0–15.0)
Lymphs Abs: 2.2 10*3/uL (ref 0.7–4.0)
MCHC: 32.7 g/dL (ref 30.0–36.0)
Monocytes Relative: 9 % (ref 3–12)
Neutro Abs: 2.6 10*3/uL (ref 1.7–7.7)
Neutrophils Relative %: 47 % (ref 43–77)
Platelets: 256 10*3/uL (ref 150–400)
RBC: 4.77 MIL/uL (ref 3.87–5.11)

## 2012-10-20 LAB — COMPREHENSIVE METABOLIC PANEL
ALT: 22 U/L (ref 0–35)
AST: 26 U/L (ref 0–37)
Alkaline Phosphatase: 86 U/L (ref 39–117)
Calcium: 9.6 mg/dL (ref 8.4–10.5)
GFR calc Af Amer: >90 mL/min (ref >90)
Glucose, Bld: 159 mg/dL — ABNORMAL HIGH (ref 70–99)
Potassium: 3.5 mEq/L (ref 3.5–5.1)
Sodium: 140 mEq/L (ref 135–145)
Total Protein: 7.7 g/dL (ref 6.0–8.3)

## 2012-10-20 MED ORDER — HEPARIN SOD (PORK) LOCK FLUSH 100 UNIT/ML IV SOLN
500.0000 [IU] | Freq: Once | INTRAVENOUS | Status: AC
  Administered 2012-10-20: 500 [IU] via INTRAVENOUS
  Filled 2012-10-20: qty 5

## 2012-10-20 MED ORDER — HEPARIN SOD (PORK) LOCK FLUSH 100 UNIT/ML IV SOLN
INTRAVENOUS | Status: AC
  Filled 2012-10-20: qty 5

## 2012-10-20 MED ORDER — SODIUM CHLORIDE 0.9 % IJ SOLN
10.0000 mL | INTRAMUSCULAR | Status: DC | PRN
  Administered 2012-10-20: 10 mL via INTRAVENOUS
  Filled 2012-10-20: qty 10

## 2012-10-20 NOTE — Progress Notes (Signed)
Vanessa [name] presented for Portacath access and flush. Portacath located lt chest wall accessed with  H 20 needle. Good blood return present. Portacath flushed with 20ml NS and 500U/46ml Heparin and needle removed intact. Procedure without incident. Patient tolerated procedure well.

## 2012-10-21 ENCOUNTER — Encounter (HOSPITAL_COMMUNITY): Payer: Self-pay

## 2012-10-21 ENCOUNTER — Encounter (HOSPITAL_BASED_OUTPATIENT_CLINIC_OR_DEPARTMENT_OTHER): Payer: BC Managed Care – PPO

## 2012-10-21 ENCOUNTER — Ambulatory Visit (HOSPITAL_COMMUNITY): Payer: BC Managed Care – PPO

## 2012-10-21 VITALS — BP 158/89 | HR 87 | Temp 98.3°F | Resp 16 | Wt 158.9 lb

## 2012-10-21 DIAGNOSIS — C189 Malignant neoplasm of colon, unspecified: Secondary | ICD-10-CM

## 2012-10-21 DIAGNOSIS — C180 Malignant neoplasm of cecum: Secondary | ICD-10-CM

## 2012-10-21 LAB — CEA: CEA: 1.3 ng/mL (ref 0.0–5.0)

## 2012-10-21 NOTE — Patient Instructions (Addendum)
[facility] Discharge Instructions  RECOMMENDATIONS MADE BY THE CONSULTANT AND ANY TEST RESULTS WILL BE SENT TO YOUR REFERRING PHYSICIAN.  Exam good today. Remember to schedule your colonoscopy. Return to clinic in 1 year for follow-up. Report any issues/concerns to clinic as needed.  Thank you for choosing [facility] to provide your oncology and hematology care.  To afford each patient quality time with our providers, please arrive at least 15 minutes before your scheduled appointment time.  With your help, our goal is to use those 15 minutes to complete the necessary work-up to ensure our physicians have the information they need to help with your evaluation and healthcare recommendations.    Effective January 1st, 2014, we ask that you re-schedule your appointment with our physicians should you arrive 10 or more minutes late for your appointment.  We strive to give you quality time with our providers, and arriving late affects you and other patients whose appointments are after yours.    Again, thank you for choosing [facility].  Our hope is that these requests will decrease the amount of time that you wait before being seen by our physicians.       _____________________________________________________________  Should you have questions after your visit to [facility], please contact our office at [phone] between the hours of 8:30 a.m. and 5:00 p.m.  Voicemails left after 4:30 p.m. will not be returned until the following business day.  For prescription refill requests, have your pharmacy contact our office with your prescription refill request.

## 2012-10-21 NOTE — Progress Notes (Signed)
[facility] Telephone:([phone]) [phone]   Fax:([phone]) [phone]  OFFICE PROGRESS NOTE  No primary provider on file. No primary provider on file.  DIAGNOSIS: Stage III colon cancer.  INTERVAL HISTORY:   According to notes form Dr. Mariel Sleet; stage III adenocarcinoma of colon presenting with a 8.4 cm cecal cancer and 5 of 14 positive nodes and a lung nodule it turned out not to be metastatic disease but we could not exclude it at that time. She had a right hemicolectomy on 09/01/2003 and then we treated her with oxaliplatinum, capecitabine, and Avastin for 6 cycles and thus far she is showing no evidence recurrence.  She is here today for her scheduled yearly follow up.  She denies any new problem . Has chronic sinus congestion which is essentially unchanged. She denies any fever or chills, no blood in the stool or melena.  She denies abdominal pain or fatigue. She states that her husband suffering from "brain cancer" and receives care here at [facility].   She also told me that she is being requested to schedule repeat colonoscopy. Her most recent one was on 10/21/2009 which was read as normal.    MEDICAL HISTORY: Past Medical History  Diagnosis Date  . Anemia   . Peptic ulcer   . Colon cancer   . Gall bladder disease     inflammation  . Yeast infection involving the vagina and surrounding area   . Abdominal  pain, other specified site     ALLERGIES:  is allergic to oxycodone-acetaminophen.  MEDICATIONS:  Current Outpatient Prescriptions  Medication Sig Dispense Refill  . amLODipine (NORVASC) 10 MG tablet Take 1 tablet (10 mg total) by mouth daily.  30 tablet  3  . Calcium Carbonate-Vitamin D (CALCIUM + D PO) Take by mouth daily.        . fluticasone (FLONASE) 50 MCG/ACT nasal spray Place 2 sprays into the nose as needed.        . Ibuprofen (MOTRIN IB PO) Take by mouth as needed.        . Multiple Vitamins-Minerals (CENTRUM SILVER PO) Take by mouth daily.        .  potassium chloride (KLOR-CON M10) 10 MEQ tablet       . Capsicum, Cayenne, (CAYENNE PEPPER PO) Take by mouth as needed.        . fish oil-omega-3 fatty acids 1000 MG capsule Take 1 g by mouth daily.        . Flaxseed, Linseed, (FLAX SEEDS) POWD Take by mouth.        . IRON COMBINATIONS PO Take 65 mg by mouth as needed.         No current facility-administered medications for this visit.    SURGICAL HISTORY:  Past Surgical History  Procedure Laterality Date  . Colon surgery    . Egd/tcs    . Bilateral nipple resections    . Prot-a-cath placement    . Colonoscopy       REVIEW OF SYSTEMS:As above otherwise negative.  PHYSICAL EXAMINATION:  Blood pressure 158/89, pulse 87, temperature 98.3 F (36.8 C), temperature source Oral, resp. rate 16, weight 158 lb 14.4 oz (72.077 kg). GENERAL: No acute distress. SKIN:  No rashes or significant lesions . No ecchymosis or petechial rash. HEAD: Normocephalic, No masses, lesions, tenderness or abnormalities  EYES: Conjunctiva are pink and non-injected and no jaundice LYMPH: No palpable lymphadenopathy, in the neck, supraclavicular areas . LUNGS: Clear to auscultation , no  crackles or wheezes HEART: regular rate & rhythm, no murmurs, no gallops, S1 normal and S2 normal and no S3. ABDOMEN: Abdomen soft, non-tender, no masses or organomegaly and no hepatosplenomegaly palpable EXTREMITIES: No edema, no skin discoloration or tenderness NEURO: Alert & oriented.  LABORATORY DATA: Lab Results  Component Value Date   WBC 5.4 10/20/2012   HGB 13.0 10/20/2012   HCT 39.8 10/20/2012   MCV 83.4 10/20/2012   PLT 256 10/20/2012      Chemistry      Component Value Date/Time   NA 140 10/20/2012 0907   K 3.5 10/20/2012 0907   CL 102 10/20/2012 0907   CO2 28 10/20/2012 0907   BUN 14 10/20/2012 0907   CREATININE 0.80 10/20/2012 0907      Component Value Date/Time   CALCIUM 9.6 10/20/2012 0907   ALKPHOS 86 10/20/2012 0907   AST 26 10/20/2012 0907   ALT 22  10/20/2012 0907   BILITOT 0.3 10/20/2012 0907       RADIOGRAPHIC STUDIES: Mm Digital Screening  10/11/2012   BI-RADS CATEGORY  1. Negative  RECOMMENDATION: Screening mammogram in one year. (Code:SM-B-01Y)   Electronically Signed   By: Gordan Payment   On: 10/11/2012 12:28     ASSESSMENT:  [name]  has no evidence of disease at this time, 9 years after surgical resection of her cecal primary adenocarcinoma lesion.  I  told her that given the natural history of colon cancer, recurrence at this stage is very unlikely. From published studies, the 3 year outcome is usually a good surrogate of the five-year outcome and most relapses tend to have been the first 2-3 years.  However given that she has a history of cancer she stands  at an increased risk of developing another cancer especially considering her relatively early age of colon cancer in which case I think that's surveillance colonoscopy will be more useful than in other interventions.  PLAN:  1. Return to clinic in one year.   2. Surveillance colonoscopy as recommended by GI   All questions were satisfactorily answered. Patient knows to call if  any concern arises.   Sherral Hammers, MD FACP. Hematology/Oncology.

## 2013-01-04 ENCOUNTER — Other Ambulatory Visit (HOSPITAL_COMMUNITY): Payer: Self-pay | Admitting: Oncology

## 2013-05-29 ENCOUNTER — Telehealth (HOSPITAL_COMMUNITY): Payer: Self-pay | Admitting: Oncology

## 2013-05-29 NOTE — Telephone Encounter (Signed)
Vanessa [name] contacted the cancer center stating she has pain in her shoulder, same side as her port-a-cath, with swelling down in her arm and hand with associated pain, limited ROM due to pain - pt reports symptoms have happened in the past with this current episode starting 2 weeks ago and worsening.  Patient advised that these types of symptoms should have been acted on when they began - pt advised to follow up with her surgeon, PCP, or the ER today as we cannot work her in for an appointment.

## 2013-05-30 ENCOUNTER — Encounter (HOSPITAL_COMMUNITY): Payer: BC Managed Care – PPO | Attending: Hematology and Oncology

## 2013-05-30 ENCOUNTER — Telehealth (HOSPITAL_COMMUNITY): Payer: Self-pay | Admitting: *Deleted

## 2013-05-30 DIAGNOSIS — E876 Hypokalemia: Secondary | ICD-10-CM | POA: Insufficient documentation

## 2013-05-30 DIAGNOSIS — C189 Malignant neoplasm of colon, unspecified: Secondary | ICD-10-CM

## 2013-05-30 DIAGNOSIS — Z79899 Other long term (current) drug therapy: Secondary | ICD-10-CM | POA: Insufficient documentation

## 2013-05-30 DIAGNOSIS — N6459 Other signs and symptoms in breast: Secondary | ICD-10-CM | POA: Insufficient documentation

## 2013-05-30 DIAGNOSIS — Z452 Encounter for adjustment and management of vascular access device: Secondary | ICD-10-CM

## 2013-05-30 DIAGNOSIS — Z85038 Personal history of other malignant neoplasm of large intestine: Secondary | ICD-10-CM | POA: Insufficient documentation

## 2013-05-30 DIAGNOSIS — C180 Malignant neoplasm of cecum: Secondary | ICD-10-CM

## 2013-05-30 DIAGNOSIS — Z9049 Acquired absence of other specified parts of digestive tract: Secondary | ICD-10-CM | POA: Insufficient documentation

## 2013-05-30 DIAGNOSIS — D649 Anemia, unspecified: Secondary | ICD-10-CM | POA: Insufficient documentation

## 2013-05-30 DIAGNOSIS — Z95828 Presence of other vascular implants and grafts: Secondary | ICD-10-CM

## 2013-05-30 DIAGNOSIS — Z9221 Personal history of antineoplastic chemotherapy: Secondary | ICD-10-CM | POA: Insufficient documentation

## 2013-05-30 DIAGNOSIS — I10 Essential (primary) hypertension: Secondary | ICD-10-CM | POA: Insufficient documentation

## 2013-05-30 DIAGNOSIS — Z809 Family history of malignant neoplasm, unspecified: Secondary | ICD-10-CM | POA: Insufficient documentation

## 2013-05-30 DIAGNOSIS — M5412 Radiculopathy, cervical region: Secondary | ICD-10-CM | POA: Insufficient documentation

## 2013-05-30 LAB — CBC WITH DIFFERENTIAL/PLATELET
BASOS ABS: 0 10*3/uL (ref 0.0–0.1)
Basophils Relative: 0 % (ref 0–1)
Eosinophils Absolute: 0.1 10*3/uL (ref 0.0–0.7)
Eosinophils Relative: 1 % (ref 0–5)
HEMATOCRIT: 39.7 % (ref 36.0–46.0)
HEMOGLOBIN: 12.8 g/dL (ref 12.0–15.0)
LYMPHS PCT: 47 % — AB (ref 12–46)
Lymphs Abs: 2.2 10*3/uL (ref 0.7–4.0)
MCH: 26.8 pg (ref 26.0–34.0)
MCHC: 32.2 g/dL (ref 30.0–36.0)
MCV: 83.1 fL (ref 78.0–100.0)
MONO ABS: 0.4 10*3/uL (ref 0.1–1.0)
Monocytes Relative: 9 % (ref 3–12)
NEUTROS ABS: 2 10*3/uL (ref 1.7–7.7)
Neutrophils Relative %: 42 % — ABNORMAL LOW (ref 43–77)
Platelets: 232 10*3/uL (ref 150–400)
RBC: 4.78 MIL/uL (ref 3.87–5.11)
RDW: 14.2 % (ref 11.5–15.5)
WBC: 4.7 10*3/uL (ref 4.0–10.5)

## 2013-05-30 LAB — COMPREHENSIVE METABOLIC PANEL
ALK PHOS: 89 U/L (ref 39–117)
ALT: 19 U/L (ref 0–35)
AST: 24 U/L (ref 0–37)
Albumin: 3.7 g/dL (ref 3.5–5.2)
BUN: 15 mg/dL (ref 6–23)
CO2: 27 meq/L (ref 19–32)
CREATININE: 0.74 mg/dL (ref 0.50–1.10)
Calcium: 9.4 mg/dL (ref 8.4–10.5)
Chloride: 101 mEq/L (ref 96–112)
GFR calc Af Amer: >90 mL/min (ref >90)
Glucose, Bld: 98 mg/dL (ref 70–99)
Potassium: 3.6 mEq/L — ABNORMAL LOW (ref 3.7–5.3)
Sodium: 141 mEq/L (ref 137–147)
Total Bilirubin: 0.3 mg/dL (ref 0.3–1.2)
Total Protein: 8 g/dL (ref 6.0–8.3)

## 2013-05-30 MED ORDER — SODIUM CHLORIDE 0.9 % IJ SOLN
10.0000 mL | INTRAMUSCULAR | Status: DC | PRN
  Administered 2013-05-30: 10 mL via INTRAVENOUS

## 2013-05-30 MED ORDER — HEPARIN SOD (PORK) LOCK FLUSH 100 UNIT/ML IV SOLN
500.0000 [IU] | Freq: Once | INTRAVENOUS | Status: AC
  Administered 2013-05-30: 500 [IU] via INTRAVENOUS

## 2013-05-30 MED ORDER — HEPARIN SOD (PORK) LOCK FLUSH 100 UNIT/ML IV SOLN
INTRAVENOUS | Status: AC
  Filled 2013-05-30: qty 5

## 2013-05-30 NOTE — Telephone Encounter (Signed)
Patient reports pain in posterior neck and feels that sometimes her left arm feels swollen. No swelling noted and she denies any swelling in her neck or dilated blood vessels in her neck. Port accessed today and flushes easily with good blood return. She also states she had one episode of Left nipple discharge for a day or 2. Last mammogram was Aug 2014. Labs done today and scheduled for MD visit on 4/8.

## 2013-05-30 NOTE — Progress Notes (Signed)
Vanessa [name] presented for Portacath access and flush. Patient reports pain in posterior neck and feels that sometimes her left arm feels swollen. No swelling noted and she denies any swelling in her neck or dilated blood vessels in her neck. She also states she had one episode of Left nipple discharge for a day or 2. Last mammogram was Aug 2014. Labs done and scheduled for MD visit on 4/8.  Portacath located lt chest wall accessed with  H 20 needle. Good blood return present. Portacath flushed with 49ml NS and 500U/23ml Heparin and needle removed intact. Procedure without incident. Patient tolerated procedure well.

## 2013-05-31 ENCOUNTER — Other Ambulatory Visit (HOSPITAL_COMMUNITY): Payer: Self-pay | Admitting: Hematology and Oncology

## 2013-05-31 ENCOUNTER — Encounter (HOSPITAL_BASED_OUTPATIENT_CLINIC_OR_DEPARTMENT_OTHER): Payer: BC Managed Care – PPO

## 2013-05-31 ENCOUNTER — Encounter (HOSPITAL_COMMUNITY): Payer: Self-pay

## 2013-05-31 VITALS — BP 180/86 | HR 80 | Temp 97.8°F | Resp 20 | Wt 163.6 lb

## 2013-05-31 DIAGNOSIS — Z85038 Personal history of other malignant neoplasm of large intestine: Secondary | ICD-10-CM

## 2013-05-31 DIAGNOSIS — M5412 Radiculopathy, cervical region: Secondary | ICD-10-CM

## 2013-05-31 DIAGNOSIS — I10 Essential (primary) hypertension: Secondary | ICD-10-CM

## 2013-05-31 DIAGNOSIS — C189 Malignant neoplasm of colon, unspecified: Secondary | ICD-10-CM

## 2013-05-31 DIAGNOSIS — N6459 Other signs and symptoms in breast: Secondary | ICD-10-CM

## 2013-05-31 DIAGNOSIS — J309 Allergic rhinitis, unspecified: Secondary | ICD-10-CM

## 2013-05-31 DIAGNOSIS — N6452 Nipple discharge: Secondary | ICD-10-CM

## 2013-05-31 DIAGNOSIS — R918 Other nonspecific abnormal finding of lung field: Secondary | ICD-10-CM

## 2013-05-31 LAB — CEA: CEA: 1.2 ng/mL (ref 0.0–5.0)

## 2013-05-31 MED ORDER — POTASSIUM CHLORIDE CRYS ER 10 MEQ PO TBCR: 10.0000 meq | EXTENDED_RELEASE_TABLET | Freq: Two times a day (BID) | ORAL | Status: DC

## 2013-05-31 NOTE — Progress Notes (Signed)
[place]  OFFICE PROGRESS NOTE  No primary provider on file. No primary provider on file.  DIAGNOSIS: No diagnosis found.  Chief Complaint  Patient presents with  . Neck pain with left upper extremity tingling  . Colon cancer stage III    CURRENT THERAPY: Watchful expectation.  INTERVAL HISTORY: Vanessa [name] 58 y.o. female returns for followup after being seen yesterday by the nursing service complaining of left upper extremity tingling with possible swelling associated with neck pain. She is known to have had stage III colon cancer status post right hemicolectomy on 09/01/2003 with a lung nodule which was presumed to be metastatic disease, status post 6 cycles of Oxaliplatin, Capecitabine and Avastin. She presented to the clinic yesterday complaining of neck pain with radiation down the left upper extremity with a feeling of swelling. She also had nipple discharge about a week ago but no longer experiences that symptom. The radiculopathy dates back at least 5-6 years. It occurs intermittently and is relieved by Aleve. She's had regular bowel movements with no diarrhea, melena, hematochezia, hematuria, PND, orthopnea, palpitations, headache, skin rash, lower extremity swelling or redness, or other joint discomfort.  MEDICAL HISTORY: Past Medical History  Diagnosis Date  . Anemia   . Peptic ulcer   . Colon cancer   . Gall bladder disease     inflammation  . Yeast infection involving the vagina and surrounding area   . Abdominal  pain, other specified site     INTERIM HISTORY:  does not have a problem list on file.   Stage III adenocarcinoma of colon presenting with a 8.4 cm cecal cancer and 5 of 14 positive nodes and a lung nodule it turned out not to be metastatic disease but we could not exclude it at that time. She had a right hemicolectomy on 09/01/2003 followed by oxaliplatin, capecitabine, and Avastin for 6 cycles.  ALLERGIES:   is allergic to oxycodone-acetaminophen.  MEDICATIONS: has a current medication list which includes the following prescription(s): amlodipine, calcium carbonate-vitamin d, capsicum (cayenne), fish oil-omega-3 fatty acids, flax seeds, fluticasone, ibuprofen, iron combinations, lisinopril-hydrochlorothiazide, multiple vitamins-minerals, and potassium chloride.  SURGICAL HISTORY:  Past Surgical History  Procedure Laterality Date  . Colon surgery    . Egd/tcs    . Bilateral nipple resections    . Prot-a-cath placement    . Colonoscopy      FAMILY HISTORY: family history includes Cancer in her mother; Heart attack in her brother.  SOCIAL HISTORY:  reports that she has never smoked. She has never used smokeless tobacco. She reports that she does not drink alcohol or use illicit drugs.  REVIEW OF SYSTEMS:  Other than that discussed above is noncontributory.  PHYSICAL EXAMINATION: ECOG PERFORMANCE STATUS: 1 - Symptomatic but completely ambulatory  There were no vitals taken for this visit.  GENERAL:alert, no distress and comfortable SKIN: skin color, texture, turgor are normal, no rashes or significant lesions EYES: PERLA; Conjunctiva are pink and non-injected, sclera clear SINUSES: No redness or tenderness over maxillary or ethmoid sinuses OROPHARYNX:no exudate, no erythema on lips, buccal mucosa, or tongue. NECK: supple, thyroid normal size, non-tender, without nodularity. No masses. Full range of motion today. CHEST: No breast masses. LifePort in place which functioned normally yesterday. LYMPH:  no palpable lymphadenopathy in the cervical, axillary or inguinal LUNGS: clear to auscultation and percussion with normal breathing effort HEART: regular rate & rhythm and no murmurs. ABDOMEN:abdomen soft, non-tender and normal  bowel sounds MUSCULOSKELETAL:no cyanosis of digits and no clubbing. Range of motion normal.  NEURO: alert & oriented x 3 with fluent speech, no focal motor/sensory  deficits   LABORATORY DATA: Infusion on 05/30/2013  Component Date Value Ref Range Status  . WBC 05/30/2013 4.7  4.0 - 10.5 K/uL Final  . RBC 05/30/2013 4.78  3.87 - 5.11 MIL/uL Final  . Hemoglobin 05/30/2013 12.8  12.0 - 15.0 g/dL Final  . HCT 81/40/2592 39.7  36.0 - 46.0 % Final  . MCV 05/30/2013 83.1  78.0 - 100.0 fL Final  . MCH 05/30/2013 26.8  26.0 - 34.0 pg Final  . MCHC 05/30/2013 32.2  30.0 - 36.0 g/dL Final  . RDW 70/17/8433 14.2  11.5 - 15.5 % Final  . Platelets 05/30/2013 232  150 - 400 K/uL Final  . Neutrophils Relative % 05/30/2013 42* 43 - 77 % Final  . Neutro Abs 05/30/2013 2.0  1.7 - 7.7 K/uL Final  . Lymphocytes Relative 05/30/2013 47* 12 - 46 % Final  . Lymphs Abs 05/30/2013 2.2  0.7 - 4.0 K/uL Final  . Monocytes Relative 05/30/2013 9  3 - 12 % Final  . Monocytes Absolute 05/30/2013 0.4  0.1 - 1.0 K/uL Final  . Eosinophils Relative 05/30/2013 1  0 - 5 % Final  . Eosinophils Absolute 05/30/2013 0.1  0.0 - 0.7 K/uL Final  . Basophils Relative 05/30/2013 0  0 - 1 % Final  . Basophils Absolute 05/30/2013 0.0  0.0 - 0.1 K/uL Final  . Sodium 05/30/2013 141  137 - 147 mEq/L Final  . Potassium 05/30/2013 3.6* 3.7 - 5.3 mEq/L Final  . Chloride 05/30/2013 101  96 - 112 mEq/L Final  . CO2 05/30/2013 27  19 - 32 mEq/L Final  . Glucose, Bld 05/30/2013 98  70 - 99 mg/dL Final  . BUN 32/74/5793 15  6 - 23 mg/dL Final  . Creatinine, Ser 05/30/2013 0.74  0.50 - 1.10 mg/dL Final  . Calcium 96/50/5257 9.4  8.4 - 10.5 mg/dL Final  . Total Protein 05/30/2013 8.0  6.0 - 8.3 g/dL Final  . Albumin 98/87/6781 3.7  3.5 - 5.2 g/dL Final  . AST 18/37/4002 24  0 - 37 U/L Final  . ALT 05/30/2013 19  0 - 35 U/L Final  . Alkaline Phosphatase 05/30/2013 89  39 - 117 U/L Final  . Total Bilirubin 05/30/2013 0.3  0.3 - 1.2 mg/dL Final  . GFR calc non Af Amer 05/30/2013 >90  >90 mL/min Final  . GFR calc Af Amer 05/30/2013 >90  >90 mL/min Final   Comment: (NOTE)                          The  eGFR has been calculated using the CKD EPI equation.                          This calculation has not been validated in all clinical situations.                          eGFR's persistently <90 mL/min signify possible Chronic Kidney                          Disease.  . CEA 05/30/2013 1.2  0.0 - 5.0 ng/mL Final   Performed at [facility]    PATHOLOGY: No  new pathology.  Urinalysis No results found for this basename: colorurine, appearanceur, labspec, phurine, glucoseu, hgbur, bilirubinur, ketonesur, proteinur, urobilinogen, nitrite, leukocytesur    RADIOGRAPHIC STUDIES: MM Digital Screening Status: Final result            Study Result    CLINICAL DATA: Screening.  EXAM:  DIGITAL SCREENING BILATERAL MAMMOGRAM WITH CAD  COMPARISON: Previous exam(s).  ACR Breast Density Category c: The breasts are heterogeneously  dense, which may obscure small masses.  FINDINGS:  There are no findings suspicious for malignancy. Images were  processed with CAD.  IMPRESSION:  No mammographic evidence of malignancy. A result letter of this  screening mammogram will be mailed directly to the patient.  BI-RADS CATEGORY 1. Negative  RECOMMENDATION:  Screening mammogram in one year. (Code:[id number]-B-01Y)  Electronically Signed  By: Enrique Sack  On: 10/11/2012 12     ASSESSMENT:  #1.Stage III adenocarcinoma of colon presenting with a 8.4 cm cecal cancer and 5 of 14 positive nodes and a lung nodule it turned out not to be metastatic disease but we could not exclude it at that time. She had a right hemicolectomy on 09/01/2003 followed by oxaliplatin, capecitabine, and Avastin for 6 cycles, no evidence of disease. #2. Hypertension, noncompliant with medication. #3. Cervical radiculopathy. #4. Hypokalemia.   PLAN:  #1. Increase Klor-Con 10 milliequivalents twice a day. #2. Left breast diagnostic mammogram. #3. MRI of the cervical spine with contrast. #4. Patient was told to take Aleve 2  tablets every 12 hours for 3 or 4 days should the neck pain radiating down the left upper extremity return. It was also recommended that she uses soft cervical collar for a few days during those episodes. #5. Return every 6 weeks for port flush. Call the day after x-rays are done for discussion of results. #6. Office visit with labs in one year.   All questions were answered. The patient knows to call the clinic with any problems, questions or concerns. We can certainly see the patient much sooner if necessary.   I spent 25 minutes counseling the patient face to face. The total time spent in the appointment was 30 minutes.    Farrel Gobble, MD 05/31/2013 11:04 AM

## 2013-05-31 NOTE — Patient Instructions (Signed)
[facility] Discharge Instructions  RECOMMENDATIONS MADE BY THE CONSULTANT AND ANY TEST RESULTS WILL BE SENT TO YOUR REFERRING PHYSICIAN. Increase Klor-Con 10 milliequivalents twice a day.  #2. Left breast diagnostic mammogram.  #3. MRI of the cervical spine with contrast.  #4. Patient was told to take Aleve 2 tablets every 12 hours for 3 or 4 days should the neck pain radiating down the left upper extremity return. It was also recommended that she uses soft cervical collar for a few days during those episodes.  #5. Return every 6 weeks for port flush. Call the day after x-rays are done for discussion of results.  #6. Office visit with labs in one year.    Thank you for choosing [facility] to provide your oncology and hematology care.  To afford each patient quality time with our providers, please arrive at least 15 minutes before your scheduled appointment time.  With your help, our goal is to use those 15 minutes to complete the necessary work-up to ensure our physicians have the information they need to help with your evaluation and healthcare recommendations.    Effective January 1st, 2014, we ask that you re-schedule your appointment with our physicians should you arrive 10 or more minutes late for your appointment.  We strive to give you quality time with our providers, and arriving late affects you and other patients whose appointments are after yours.    Again, thank you for choosing [facility].  Our hope is that these requests will decrease the amount of time that you wait before being seen by our physicians.       _____________________________________________________________  Should you have questions after your visit to [facility], please contact our office at [phone] between the hours of 8:30 a.m. and 5:00 p.m.  Voicemails left after 4:30 p.m. will not be returned until the following business day.  For prescription  refill requests, have your pharmacy contact our office with your prescription refill request.

## 2013-06-05 ENCOUNTER — Other Ambulatory Visit (HOSPITAL_COMMUNITY): Payer: Self-pay | Admitting: Oncology

## 2013-06-05 ENCOUNTER — Other Ambulatory Visit (HOSPITAL_COMMUNITY): Payer: Self-pay | Admitting: Hematology and Oncology

## 2013-06-05 ENCOUNTER — Ambulatory Visit (HOSPITAL_COMMUNITY)
Admission: RE | Admit: 2013-06-05 | Discharge: 2013-06-05 | Disposition: A | Discharge status: Home / Self Care | Payer: BC Managed Care – PPO | Source: Ambulatory Visit | Attending: Hematology and Oncology | Admitting: Hematology and Oncology

## 2013-06-05 DIAGNOSIS — M5412 Radiculopathy, cervical region: Secondary | ICD-10-CM

## 2013-06-05 MED ORDER — ALPRAZOLAM 0.5 MG PO TABS: ORAL_TABLET | ORAL | Status: DC

## 2013-06-08 ENCOUNTER — Encounter (HOSPITAL_COMMUNITY): Payer: BC Managed Care – PPO

## 2013-06-08 ENCOUNTER — Ambulatory Visit (HOSPITAL_COMMUNITY)
Admission: RE | Admit: 2013-06-08 | Discharge: 2013-06-08 | Disposition: A | Discharge status: Home / Self Care | Payer: BC Managed Care – PPO | Source: Ambulatory Visit | Attending: Hematology and Oncology | Admitting: Hematology and Oncology

## 2013-06-08 DIAGNOSIS — M47812 Spondylosis without myelopathy or radiculopathy, cervical region: Secondary | ICD-10-CM | POA: Insufficient documentation

## 2013-06-08 DIAGNOSIS — M5412 Radiculopathy, cervical region: Secondary | ICD-10-CM

## 2013-06-08 DIAGNOSIS — M542 Cervicalgia: Secondary | ICD-10-CM | POA: Insufficient documentation

## 2013-06-09 ENCOUNTER — Encounter (HOSPITAL_COMMUNITY): Payer: Self-pay

## 2013-06-09 NOTE — Progress Notes (Signed)
Vanessa Gobble, MD P Onc Nurse Ap            Please call Vanessa [name] and tell her the CT of the cervical spine just showed degenerative joint disease, no cancer. Use cervical collar(soft) during the day if radiculopathy returns along with Aleve twice a day. Thanks. Dr.F      Patient notified and verbalized understanding of instructions.

## 2013-06-14 ENCOUNTER — Encounter (HOSPITAL_COMMUNITY): Payer: BC Managed Care – PPO

## 2013-06-21 ENCOUNTER — Ambulatory Visit (HOSPITAL_COMMUNITY)
Admission: RE | Admit: 2013-06-21 | Discharge: 2013-06-21 | Disposition: A | Discharge status: Home / Self Care | Payer: BC Managed Care – PPO | Source: Ambulatory Visit | Attending: Hematology and Oncology | Admitting: Hematology and Oncology

## 2013-06-21 ENCOUNTER — Encounter (HOSPITAL_COMMUNITY): Payer: BC Managed Care – PPO

## 2013-06-21 DIAGNOSIS — N6459 Other signs and symptoms in breast: Secondary | ICD-10-CM | POA: Insufficient documentation

## 2013-06-21 DIAGNOSIS — N6452 Nipple discharge: Secondary | ICD-10-CM

## 2013-07-12 ENCOUNTER — Encounter (HOSPITAL_COMMUNITY): Payer: BC Managed Care – PPO

## 2013-08-02 ENCOUNTER — Other Ambulatory Visit: Payer: Self-pay | Admitting: Adult Health

## 2013-08-09 ENCOUNTER — Other Ambulatory Visit (HOSPITAL_COMMUNITY)
Admission: RE | Admit: 2013-08-09 | Discharge: 2013-08-09 | Disposition: A | Discharge status: Home / Self Care | Payer: BC Managed Care – PPO | Source: Ambulatory Visit | Attending: Obstetrics and Gynecology | Admitting: Obstetrics and Gynecology

## 2013-08-09 ENCOUNTER — Ambulatory Visit (INDEPENDENT_AMBULATORY_CARE_PROVIDER_SITE_OTHER): Payer: BC Managed Care – PPO | Admitting: Obstetrics and Gynecology

## 2013-08-09 ENCOUNTER — Encounter: Payer: Self-pay | Admitting: Obstetrics and Gynecology

## 2013-08-09 VITALS — BP 149/98 | Ht 63.0 in | Wt 165.0 lb

## 2013-08-09 DIAGNOSIS — Z1212 Encounter for screening for malignant neoplasm of rectum: Secondary | ICD-10-CM

## 2013-08-09 DIAGNOSIS — Z1151 Encounter for screening for human papillomavirus (HPV): Secondary | ICD-10-CM | POA: Insufficient documentation

## 2013-08-09 DIAGNOSIS — Z01419 Encounter for gynecological examination (general) (routine) without abnormal findings: Secondary | ICD-10-CM | POA: Insufficient documentation

## 2013-08-09 LAB — HEMOCCULT GUIAC POC 1CARD (OFFICE): Fecal Occult Blood, POC: NEGATIVE

## 2013-08-09 MED ORDER — PHENTERMINE HCL 37.5 MG PO CAPS: 37.5000 mg | ORAL_CAPSULE | ORAL | Status: DC

## 2013-08-09 MED ORDER — TRAMADOL HCL 50 MG PO TABS: 50.0000 mg | ORAL_TABLET | Freq: Four times a day (QID) | ORAL | Status: DC | PRN

## 2013-08-09 NOTE — Progress Notes (Signed)
Patient ID: Vanessa [name], female   DOB: March 29, 1955, 58 y.o.   MRN: [id number] This chart was scribed by Ludger Nutting, Medical Scribe, for Dr. Mallory Shirk on 08/09/13 at 10:33 AM. This chart was reviewed by Dr. Mallory Shirk for accuracy.    Assessment:  1. Annual Gyn Exam age 58 2. History of stable fibroid 3. Weight loss requested  4. LLQ discomfort , chronic , 2 fibroid.   Plan:  1. pap smear done, next pap due 3 years 2. return annually or prn 3    Annual mammogram advised 4.   Phentermine 37.5 mg QD ref x1 Subjective:  Vanessa [name] is a 58 y.o. female No obstetric history on file. who presents for annual exam. No LMP recorded. Patient is postmenopausal. The patient has complaints today of fluid retention.   The following portions of the patient's history were reviewed and updated as appropriate: allergies, current medications, past family history, past medical history, past social history, past surgical history and problem list.  Review of Systems Constitutional: Negative Gastrointestinal: negative Genitourinary: Negative  Objective:  BP 149/98  Ht 5\' 3"  (1.6 m)  Wt 165 lb (74.844 kg)  BMI 29.24 kg/m2   BMI: Body mass index is 29.24 kg/(m^2).  General Appearance: Alert, appropriate appearance for age. No acute distress HEENT: Grossly normal Neck / Thyroid:  Cardiovascular: RRR; normal S1, S2, no murmur Lungs: CTA bilaterally Back: No CVAT Breast Exam: No dimpling, nipple retraction or discharge. No masses or nodes., Normal to inspection, Normal breast tissue bilaterally and No masses or nodes.No dimpling, nipple retraction or discharge. Gastrointestinal: Soft, non-tender, no masses or organomegaly Pelvic Exam: External genitalia: normal general appearance Vaginal: normal mucosa without prolapse or lesions and normal without tenderness, induration or masses Cervix: normal appearance and non-purulent, pap done Adnexa: slight fullness on left consistent with old fibroid  noted previously Uterus: normal single, nontender, anteverted and mobile Rectovaginal: normal rectal, no masses and guaiac negative stool obtained Lymphatic Exam: Non-palpable nodes in neck, clavicular, axillary, or inguinal regions Skin: no rash or abnormalities Neurologic: Normal gait and speech, no tremor  Psychiatric: Alert and oriented, appropriate affect.  Urinalysis:Not done  Mallory Shirk. MD Pgr [phone] 10:33 AM

## 2013-08-10 LAB — CYTOLOGY - PAP

## 2013-08-23 ENCOUNTER — Encounter (HOSPITAL_COMMUNITY): Payer: BC Managed Care – PPO

## 2013-09-20 ENCOUNTER — Other Ambulatory Visit (INDEPENDENT_AMBULATORY_CARE_PROVIDER_SITE_OTHER): Payer: Self-pay | Admitting: *Deleted

## 2013-09-20 DIAGNOSIS — Z85038 Personal history of other malignant neoplasm of large intestine: Secondary | ICD-10-CM

## 2013-10-04 ENCOUNTER — Encounter (HOSPITAL_COMMUNITY): Payer: BC Managed Care – PPO

## 2013-10-20 ENCOUNTER — Ambulatory Visit (HOSPITAL_COMMUNITY): Payer: BC Managed Care – PPO

## 2013-10-25 ENCOUNTER — Telehealth (INDEPENDENT_AMBULATORY_CARE_PROVIDER_SITE_OTHER): Payer: Self-pay | Admitting: *Deleted

## 2013-10-25 DIAGNOSIS — Z1211 Encounter for screening for malignant neoplasm of colon: Secondary | ICD-10-CM

## 2013-10-25 NOTE — Telephone Encounter (Signed)
Patient needs movi prep 

## 2013-10-27 MED ORDER — PEG-KCL-NACL-NASULF-NA ASC-C 100 G PO SOLR: 1.0000 | Freq: Once | ORAL | Status: DC

## 2013-11-03 ENCOUNTER — Other Ambulatory Visit (HOSPITAL_COMMUNITY): Payer: Self-pay | Admitting: Oncology

## 2013-11-03 DIAGNOSIS — I10 Essential (primary) hypertension: Secondary | ICD-10-CM

## 2013-11-03 MED ORDER — LISINOPRIL-HYDROCHLOROTHIAZIDE 10-12.5 MG PO TABS: 1.0000 | ORAL_TABLET | Freq: Every day | ORAL | Status: DC

## 2013-11-08 ENCOUNTER — Telehealth (INDEPENDENT_AMBULATORY_CARE_PROVIDER_SITE_OTHER): Payer: Self-pay | Admitting: *Deleted

## 2013-11-08 NOTE — Telephone Encounter (Signed)
agree

## 2013-11-08 NOTE — Telephone Encounter (Signed)
  Procedure: tcs  Reason/Indication:  Hx colon ca  Has patient had this procedure before?  Yes, 2011 -- scanned  If so, when, by whom and where?    Is there a family history of colon cancer?  no  Who?  What age when diagnosed?    Is patient diabetic?   no      Does patient have prosthetic heart valve?  no  Do you have a pacemaker?  no  Has patient ever had endocarditis? no  Has patient had joint replacement within last 12 months?  no  Does patient tend to be constipated or take laxatives? no  Is patient on Coumadin, Plavix and/or Aspirin? no  Medications: centrum silver daily, aleve prn, potassium 10 mg daily, turmeric prn, omega red, fish oil 1000 mg daily, cinnamon 500 mg, flonase prn, lisinopril 10/1.5 mg daily, flex seed oil, cranberry 84 mg, caltrate w/ vit d  Allergies: tyloxx  Medication Adjustment:   Procedure date & time: 11/29/13 at 1030

## 2013-11-15 ENCOUNTER — Encounter (HOSPITAL_COMMUNITY): Payer: BC Managed Care – PPO

## 2013-11-17 ENCOUNTER — Encounter (HOSPITAL_COMMUNITY): Payer: Self-pay | Admitting: Pharmacy Technician

## 2013-11-29 ENCOUNTER — Encounter (HOSPITAL_COMMUNITY): Payer: Self-pay | Admitting: *Deleted

## 2013-11-29 ENCOUNTER — Encounter (HOSPITAL_COMMUNITY)
Admission: RE | Disposition: A | Discharge status: Home / Self Care | Payer: Self-pay | Source: Ambulatory Visit | Attending: Internal Medicine

## 2013-11-29 ENCOUNTER — Ambulatory Visit (HOSPITAL_COMMUNITY)
Admission: RE | Admit: 2013-11-29 | Discharge: 2013-11-29 | Disposition: A | Discharge status: Home / Self Care | Payer: BC Managed Care – PPO | Source: Ambulatory Visit | Attending: Internal Medicine | Admitting: Internal Medicine

## 2013-11-29 DIAGNOSIS — Z85038 Personal history of other malignant neoplasm of large intestine: Secondary | ICD-10-CM

## 2013-11-29 DIAGNOSIS — M1990 Unspecified osteoarthritis, unspecified site: Secondary | ICD-10-CM | POA: Diagnosis not present

## 2013-11-29 DIAGNOSIS — Z885 Allergy status to narcotic agent status: Secondary | ICD-10-CM | POA: Diagnosis not present

## 2013-11-29 DIAGNOSIS — Z9049 Acquired absence of other specified parts of digestive tract: Secondary | ICD-10-CM | POA: Diagnosis not present

## 2013-11-29 DIAGNOSIS — K621 Rectal polyp: Secondary | ICD-10-CM | POA: Insufficient documentation

## 2013-11-29 DIAGNOSIS — K5730 Diverticulosis of large intestine without perforation or abscess without bleeding: Secondary | ICD-10-CM | POA: Diagnosis not present

## 2013-11-29 DIAGNOSIS — Z1211 Encounter for screening for malignant neoplasm of colon: Secondary | ICD-10-CM | POA: Insufficient documentation

## 2013-11-29 DIAGNOSIS — D128 Benign neoplasm of rectum: Secondary | ICD-10-CM

## 2013-11-29 DIAGNOSIS — K5720 Diverticulitis of large intestine with perforation and abscess without bleeding: Secondary | ICD-10-CM

## 2013-11-29 HISTORY — PX: COLONOSCOPY: SHX5424

## 2013-11-29 SURGERY — COLONOSCOPY
Anesthesia: Moderate Sedation

## 2013-11-29 MED ORDER — MIDAZOLAM HCL 5 MG/5ML IJ SOLN
INTRAMUSCULAR | Status: DC | PRN
  Administered 2013-11-29 (×2): 2 mg via INTRAVENOUS
  Administered 2013-11-29: 1 mg via INTRAVENOUS

## 2013-11-29 MED ORDER — MEPERIDINE HCL 50 MG/ML IJ SOLN
INTRAMUSCULAR | Status: AC
  Filled 2013-11-29: qty 1

## 2013-11-29 MED ORDER — SODIUM CHLORIDE 0.9 % IV SOLN
INTRAVENOUS | Status: DC
  Administered 2013-11-29: 1000 mL via INTRAVENOUS

## 2013-11-29 MED ORDER — MEPERIDINE HCL 50 MG/ML IJ SOLN
INTRAMUSCULAR | Status: DC | PRN
  Administered 2013-11-29 (×2): 25 mg via INTRAVENOUS

## 2013-11-29 MED ORDER — METRONIDAZOLE 500 MG PO TABS: 250.0000 mg | ORAL_TABLET | Freq: Three times a day (TID) | ORAL | Status: DC

## 2013-11-29 MED ORDER — MIDAZOLAM HCL 5 MG/5ML IJ SOLN
INTRAMUSCULAR | Status: AC
  Filled 2013-11-29: qty 10

## 2013-11-29 NOTE — Op Note (Signed)
[facility] [address] [place], [address]   COLONOSCOPY PROCEDURE REPORT     EXAM DATE: 01-Dec-2013  PATIENT NAME:      Vanessa [name], Vanessa [name]           MR #:      [id number]  BIRTHDATE:       07/03/1955      VISIT #:     [id number]  ATTENDING:     Hildred Laser, MD     STATUS:     outpatient REFERRING MD: ASA CLASS:        Class I  INDICATIONS:  The patient is a 58 yr old female here for a colonoscopy due to high risk personal history of colon cancer.  PROCEDURE PERFORMED:     Colonoscopy, surveillance and Colonoscopy with biopsy MEDICATIONS:     Cetacaine spray for oral pharyngeal topical anesthesia, Meperidine (Demerol) 50 mg IV, and Versed 5 mg IV  ESTIMATED BLOOD LOSS:     None  CONSENT: The patient understands the risks and benefits of the procedure and understands that these risks include, but are not limited to: sedation, allergic reaction, infection, perforation and/or bleeding. Alternative means of evaluation and treatment include, among others: physical exam, x-rays, and/or surgical intervention. The patient elects to proceed with this endoscopic procedure.  DESCRIPTION OF PROCEDURE: During intra-op preparation period all mechanical & medical equipment was checked for proper function. Hand hygiene and appropriate measures for infection prevention was taken. After the risks, benefits and alternatives of the procedure were thoroughly explained, Informed consent was verified, confirmed and timeout was successfully executed by the treatment team. A digital exam revealed no abnormalities of the rectum.      The [id number] ([id number]) endoscope was introduced through the anus and advanced to the hepatic flexure and area of ileocolonic anastomosis.. No adverse events experienced. The prep was excellent.. The instrument was then slowly withdrawn as the colon was fully examined.   COLON FINDINGS: Diverticulum was found at the hepatic flexure.    A single polyp measuring 4 mm in size was found in the rectum. Multiple biopsies were performed using cold forceps.  Sample was obtained and sent to histology. Retroflexed views revealed no abnormalities.  The scope was then completely withdrawn from the patient and the procedure terminated. WITHDRAWAL TIME: 12 minutes 0 seconds    ADVERSE EVENTS:      There were no immediate complications.   IMPRESSIONS:     1.  Diverticulum at the hepatic flexure 2.  Single rectal polypablated via cold biopsy.  RECOMMENDATIONS:     Await biopsy results RECALL:     Return in 4 years for Colonoscopy.  Hildred Laser, MD eSigned:  Hildred Laser, MD 12/01/13 11:39 AM   cc:  CPT CODES: ICD CODES:  The ICD and CPT codes recommended by this software are interpretations from the data that the clinical staff has captured with the software.  The verification of the translation of this report to the ICD and CPT codes and modifiers is the sole responsibility of the health care institution and practicing physician where this report was generated.  [employer]. will not be held responsible for the validity of the ICD and CPT codes included on this report.  AMA assumes no liability for data contained or not contained herein. CPT is a Designer, television/film set of the [employer].

## 2013-11-29 NOTE — Discharge Instructions (Signed)
Resume usual medications and diet. Metronidazole 250 mg by mouth after each meal or 3 times a day for 2 weeks. No driving for 24 hours. Physician will call with biopsy results. Please call the office with progress report when you finish antibiotic.  Colonoscopy, Care After Refer to this sheet in the next few weeks. These instructions provide you with information on caring for yourself after your procedure. Your health care provider may also give you more specific instructions. Your treatment has been planned according to current medical practices, but problems sometimes occur. Call your health care provider if you have any problems or questions after your procedure. WHAT TO EXPECT AFTER THE PROCEDURE  After your procedure, it is typical to have the following:  A small amount of blood in your stool.  Moderate amounts of gas and mild abdominal cramping or bloating. HOME CARE INSTRUCTIONS  Do not drive, operate machinery, or sign important documents for 24 hours.  You may shower and resume your regular physical activities, but move at a slower pace for the first 24 hours.  Take frequent rest periods for the first 24 hours.  Walk around or put a warm pack on your abdomen to help reduce abdominal cramping and bloating.  Drink enough fluids to keep your urine clear or pale yellow.  You may resume your normal diet as instructed by your health care provider. Avoid heavy or fried foods that are hard to digest.  Avoid drinking alcohol for 24 hours or as instructed by your health care provider.  Only take over-the-counter or prescription medicines as directed by your health care provider.  If a tissue sample (biopsy) was taken during your procedure:  Do not take aspirin or blood thinners for 7 days, or as instructed by your health care provider.  Do not drink alcohol for 7 days, or as instructed by your health care provider.  Eat soft foods for the first 24 hours. SEEK MEDICAL CARE  IF: You have persistent spotting of blood in your stool 2-3 days after the procedure. SEEK IMMEDIATE MEDICAL CARE IF:  You have more than a small spotting of blood in your stool.  You pass large blood clots in your stool.  Your abdomen is swollen (distended).  You have nausea or vomiting.  You have a fever.  You have increasing abdominal pain that is not relieved with medicine. Document Released: 09/24/2003 Document Revised: 11/30/2012 Document Reviewed: 10/17/2012 [facility] Patient Information 2015 [place], [place]. This information is not intended to replace advice given to you by your health care provider. Make sure you discuss any questions you have with your health care provider.

## 2013-11-29 NOTE — Progress Notes (Signed)
At anastomosis at 1116

## 2013-11-29 NOTE — H&P (Signed)
Vanessa [name] is an 58 y.o. female.   Chief Complaint: Patient is here for colonoscopy. HPI: Patient is 58 year-old female with history of colon carcinoma and is here for surveillance colonoscopy. She underwent right hemicolectomy in July 2005 followed by chemotherapy and she has remained in remission. Last colonoscopy was at [facility] in [place] in August 2011. She denies abdominal pain or rectal bleeding. She complains of excessive flatulence. She denies diarrhea. History is negative for [facility]  Past Medical History  Diagnosis Date  . Anemia   . Peptic ulcer   . Colon cancer   . Gall bladder disease     inflammation  . Yeast infection involving the vagina and surrounding area   . Abdominal pain, other specified site   . Arthritis   . Fibroids     Past Surgical History  Procedure Laterality Date  . Colon surgery    . Egd/tcs    . Bilateral nipple resections    . Prot-a-cath placement    . Colonoscopy      Family History  Problem Relation Age of Onset  . Cancer Mother     lung cancer  . Heart attack Brother   . Kidney disease Sister   . Thyroid nodules Daughter   . Fibroids Sister    Social History:  reports that she has never smoked. She has never used smokeless tobacco. She reports that she does not drink alcohol or use illicit drugs.  Allergies:  Allergies  Allergen Reactions  . Oxycodone-Acetaminophen     "burning up"     Medications Prior to Admission  Medication Sig Dispense Refill  . Calcium Carbonate-Vitamin D (CALCIUM + D PO) Take by mouth daily.        . Cinnamon 500 MG capsule Take 500 mg by mouth daily.      . fish oil-omega-3 fatty acids 1000 MG capsule Take 1 g by mouth daily.        . fluticasone (FLONASE) 50 MCG/ACT nasal spray Place 2 sprays into the nose as needed for allergies.       Marland Kitchen ibuprofen (ADVIL,MOTRIN) 200 MG tablet Take 200 mg by mouth daily as needed for moderate pain.      Marland Kitchen lisinopril-hydrochlorothiazide (PRINZIDE,ZESTORETIC)  10-12.5 MG per tablet Take 1 tablet by mouth daily.  30 tablet  5  . Multiple Vitamins-Minerals (CENTRUM SILVER PO) Take by mouth daily.        . naproxen sodium (ANAPROX) 220 MG tablet Take 220 mg by mouth daily as needed (pain).      . peg 3350 powder (MOVIPREP) 100 G SOLR Take 1 kit (200 g total) by mouth once.  1 kit  0  . phentermine 37.5 MG capsule Take 1 capsule (37.5 mg total) by mouth every morning.  30 capsule  1  . potassium chloride (KLOR-CON M10) 10 MEQ tablet Take 1 tablet (10 mEq total) by mouth 2 (two) times daily.  60 tablet  6  . Simethicone (GAS RELIEF EXTRA STRENGTH PO) Take 1-2 tablets by mouth daily as needed (gas relief).      . traMADol (ULTRAM) 50 MG tablet Take 1 tablet (50 mg total) by mouth every 6 (six) hours as needed.  60 tablet  0    No results found for this or any previous visit (from the past 48 hour(s)). No results found.  ROS  Blood pressure 162/91, temperature 98.4 F (36.9 C), temperature source Oral, resp. rate 16, height $RemoveBe'5\' 3"'kAqgLkOIo$  (1.6 m), weight  165 lb (74.844 kg), SpO2 97.00%. Physical Exam  Constitutional: She appears well-developed and well-nourished.  HENT:  Mouth/Throat: Oropharynx is clear and moist.  Eyes: Conjunctivae are normal. No scleral icterus.  Neck: No thyromegaly present.  Cardiovascular: Normal rate and regular rhythm.   No murmur heard. Respiratory: Effort normal and breath sounds normal.  GI: Soft. She exhibits no distension and no mass. There is no tenderness.  Musculoskeletal: She exhibits no edema.  Lymphadenopathy:    She has no cervical adenopathy.  Neurological: She is alert.  Skin: Skin is warm and dry.     Assessment/Plan History of colon carcinoma. Surveillance colonoscopy.  REHMAN,NAJEEB U 11/29/2013, 11:01 AM

## 2013-12-01 ENCOUNTER — Encounter (HOSPITAL_COMMUNITY): Payer: Self-pay | Admitting: Internal Medicine

## 2013-12-01 ENCOUNTER — Telehealth (INDEPENDENT_AMBULATORY_CARE_PROVIDER_SITE_OTHER): Payer: Self-pay | Admitting: *Deleted

## 2013-12-01 NOTE — Telephone Encounter (Signed)
Patient was given Flagyl 500 mg tablet was instructed to take 250 mg by mouth three times daily. #42 Patient is having trouble cutting the pill in half , and is concerned about getting the correct amount. Per Dr. Laural Golden the patient is to take 1 500 mg tablet by mouth twice daily for 14 days/2 weeks. Patient was called and made aware.

## 2013-12-06 ENCOUNTER — Telehealth (INDEPENDENT_AMBULATORY_CARE_PROVIDER_SITE_OTHER): Payer: Self-pay | Admitting: *Deleted

## 2013-12-06 NOTE — Telephone Encounter (Signed)
The Flagel is making her sick by: face is sweatly, weak, stomach is swollen and fells like she has a yeast infection. She is taking 250 mg more than he originally wanted her to take. Shailyn thinks this is a little to much for her, maybe 500 mg daily would be best. Her return phone number is [phone].

## 2013-12-07 NOTE — Telephone Encounter (Signed)
I have attempted several time to reach patient by phone. After numerous rings,it ask that I enter by access code. Per Dr. Laural Golden patient is to stop Flagyl. Call in Mycostatin (Nystatin) Suspension-patient is to take 5 mls swish and swallow 4 times daily for 10 days. This has been called to [facility].

## 2013-12-15 ENCOUNTER — Other Ambulatory Visit (HOSPITAL_COMMUNITY): Payer: Self-pay | Admitting: Hematology and Oncology

## 2013-12-15 DIAGNOSIS — Z1231 Encounter for screening mammogram for malignant neoplasm of breast: Secondary | ICD-10-CM

## 2013-12-21 ENCOUNTER — Ambulatory Visit (HOSPITAL_COMMUNITY)
Admission: RE | Admit: 2013-12-21 | Discharge: 2013-12-21 | Disposition: A | Discharge status: Home / Self Care | Payer: BC Managed Care – PPO | Source: Ambulatory Visit | Attending: Hematology and Oncology | Admitting: Hematology and Oncology

## 2013-12-21 DIAGNOSIS — Z1231 Encounter for screening mammogram for malignant neoplasm of breast: Secondary | ICD-10-CM | POA: Diagnosis not present

## 2013-12-27 ENCOUNTER — Encounter (HOSPITAL_COMMUNITY): Payer: BC Managed Care – PPO

## 2014-01-15 ENCOUNTER — Encounter (HOSPITAL_COMMUNITY): Payer: Self-pay | Admitting: Oncology

## 2014-01-15 ENCOUNTER — Encounter (HOSPITAL_COMMUNITY): Payer: BC Managed Care – PPO | Attending: Oncology | Admitting: Oncology

## 2014-01-15 ENCOUNTER — Other Ambulatory Visit (HOSPITAL_COMMUNITY): Payer: Self-pay | Admitting: Oncology

## 2014-01-15 DIAGNOSIS — C180 Malignant neoplasm of cecum: Secondary | ICD-10-CM | POA: Insufficient documentation

## 2014-01-15 DIAGNOSIS — M1990 Unspecified osteoarthritis, unspecified site: Secondary | ICD-10-CM

## 2014-01-15 DIAGNOSIS — M129 Arthropathy, unspecified: Secondary | ICD-10-CM

## 2014-01-15 DIAGNOSIS — I10 Essential (primary) hypertension: Secondary | ICD-10-CM

## 2014-01-15 HISTORY — DX: Malignant neoplasm of cecum: C18.0

## 2014-01-15 MED ORDER — NAPROXEN 500 MG PO TABS: 500.0000 mg | ORAL_TABLET | Freq: Two times a day (BID) | ORAL | Status: DC

## 2014-01-15 MED ORDER — LISINOPRIL-HYDROCHLOROTHIAZIDE 20-12.5 MG PO TABS: 1.0000 | ORAL_TABLET | Freq: Every day | ORAL | Status: DC

## 2014-01-15 NOTE — Progress Notes (Signed)
Vanessa [name] is seen as a work-in when she wanted some of her medical issues addressed by [place] at the conclusion of her husband's appointment.  "Now its my turn, can I ask you a few things?"  Thinking she was talking about her husband's care I agreed to answer her questions.  However, she wants to speak of her care.  "Dr. Barnet Glasgow diagnosed me with arthritis and since I do not have a primary care physician, can I bring the disability papers to you to fill out."  I have agreed to do so.  She is informed that I will fill out the papers and I have no say on whether her claim is approved or denied.    "I don't think my blood pressure medication is working."  She denies any BP reading at home.  Recorded BPs in [facility] are as follows:  Vitals - 1 value per visit 11/29/2013 08/09/2013 05/31/2013 10/21/2012 8/56/3149  SYSTOLIC [phone]  DIASTOLIC 82 [phone] 93   Vitals - 1 value per visit 06/04/8784  SYSTOLIC 767  DIASTOLIC 89   Given these readings, increasing Lisinopril/HCTZ is indicated.  I will increase dose to 20-12.5 mg daily.    "My daughter is a Marine scientist and she recommended Naproxen for my arthritis pain. Can I have a prescription for that?"  I did not identify a contraindication to this medication, so I wrote a prescription for that as well.   [name] would benefit from establishment of a primary care provider, particularly since she is appropriate for discharge from the [facility] after completing 10 years worth of surveillance for Stage III Colon Cancer.  She will need continued GI follow-up.  This will need to be discussed on her next office visit.   Patient and plan discussed with Dr. Farrel Gobble and he is in agreement with the aforementioned.   KEFALAS,THOMAS 01/15/2014

## 2014-01-15 NOTE — Patient Instructions (Signed)
[place] Discharge Instructions  RECOMMENDATIONS MADE BY THE CONSULTANT AND ANY TEST RESULTS WILL BE SENT TO YOUR REFERRING PHYSICIAN.  Rx for Naproxen provided today for arthritic pain Rx for Lisinopril/HCTZ 20-12.5 mg daily provided Return as planned and as scheduled for follow-up  Thank you for choosing [facility] to provide your oncology and hematology care.  To afford each patient quality time with our providers, please arrive at least 15 minutes before your scheduled appointment time.  With your help, our goal is to use those 15 minutes to complete the necessary work-up to ensure our physicians have the information they need to help with your evaluation and healthcare recommendations.    Effective January 1st, 2014, we ask that you re-schedule your appointment with our physicians should you arrive 10 or more minutes late for your appointment.  We strive to give you quality time with our providers, and arriving late affects you and other patients whose appointments are after yours.    Again, thank you for choosing [facility].  Our hope is that these requests will decrease the amount of time that you wait before being seen by our physicians.       _____________________________________________________________  Should you have questions after your visit to [facility], please contact our office at [phone] between the hours of 8:30 a.m. and 5:00 p.m.  Voicemails left after 4:30 p.m. will not be returned until the following business day.  For prescription refill requests, have your pharmacy contact our office with your prescription refill request.

## 2014-02-07 ENCOUNTER — Encounter (HOSPITAL_COMMUNITY): Payer: BC Managed Care – PPO

## 2014-03-21 ENCOUNTER — Encounter (HOSPITAL_COMMUNITY): Payer: BC Managed Care – PPO

## 2014-05-02 ENCOUNTER — Encounter (HOSPITAL_COMMUNITY): Payer: BC Managed Care – PPO

## 2014-05-24 ENCOUNTER — Other Ambulatory Visit (HOSPITAL_COMMUNITY): Payer: Self-pay

## 2014-05-24 DIAGNOSIS — C180 Malignant neoplasm of cecum: Secondary | ICD-10-CM

## 2014-05-30 ENCOUNTER — Other Ambulatory Visit (HOSPITAL_COMMUNITY): Payer: BC Managed Care – PPO

## 2014-05-31 ENCOUNTER — Encounter (HOSPITAL_COMMUNITY): Payer: 59 | Attending: Oncology

## 2014-05-31 DIAGNOSIS — C180 Malignant neoplasm of cecum: Secondary | ICD-10-CM | POA: Insufficient documentation

## 2014-05-31 LAB — COMPREHENSIVE METABOLIC PANEL
ALT: 33 U/L (ref 0–35)
ANION GAP: 9 (ref 5–15)
AST: 28 U/L (ref 0–37)
Albumin: 3.9 g/dL (ref 3.5–5.2)
Alkaline Phosphatase: 77 U/L (ref 39–117)
BUN: 23 mg/dL (ref 6–23)
CO2: 27 mmol/L (ref 19–32)
Calcium: 9.4 mg/dL (ref 8.4–10.5)
Chloride: 105 mmol/L (ref 96–112)
Creatinine, Ser: 0.88 mg/dL (ref 0.50–1.10)
GFR calc non Af Amer: 70 mL/min — ABNORMAL LOW (ref >90)
GFR, EST AFRICAN AMERICAN: 81 mL/min — AB (ref >90)
GLUCOSE: 118 mg/dL — AB (ref 70–99)
Potassium: 3.6 mmol/L (ref 3.5–5.1)
Sodium: 141 mmol/L (ref 135–145)
TOTAL PROTEIN: 7.7 g/dL (ref 6.0–8.3)
Total Bilirubin: 0.6 mg/dL (ref 0.3–1.2)

## 2014-05-31 LAB — CBC WITH DIFFERENTIAL/PLATELET
Basophils Absolute: 0 10*3/uL (ref 0.0–0.1)
Basophils Relative: 0 % (ref 0–1)
EOS ABS: 0.1 10*3/uL (ref 0.0–0.7)
EOS PCT: 1 % (ref 0–5)
HCT: 39.3 % (ref 36.0–46.0)
Hemoglobin: 12.6 g/dL (ref 12.0–15.0)
Lymphocytes Relative: 39 % (ref 12–46)
Lymphs Abs: 2.6 10*3/uL (ref 0.7–4.0)
MCH: 27.3 pg (ref 26.0–34.0)
MCHC: 32.1 g/dL (ref 30.0–36.0)
MCV: 85.1 fL (ref 78.0–100.0)
Monocytes Absolute: 0.4 10*3/uL (ref 0.1–1.0)
Monocytes Relative: 6 % (ref 3–12)
Neutro Abs: 3.5 10*3/uL (ref 1.7–7.7)
Neutrophils Relative %: 54 % (ref 43–77)
PLATELETS: 243 10*3/uL (ref 150–400)
RBC: 4.62 MIL/uL (ref 3.87–5.11)
RDW: 14.9 % (ref 11.5–15.5)
WBC: 6.6 10*3/uL (ref 4.0–10.5)

## 2014-05-31 NOTE — Progress Notes (Signed)
LABS DRAWN

## 2014-06-01 ENCOUNTER — Ambulatory Visit (HOSPITAL_COMMUNITY): Payer: BC Managed Care – PPO | Admitting: Hematology & Oncology

## 2014-06-01 ENCOUNTER — Other Ambulatory Visit (HOSPITAL_COMMUNITY): Payer: BC Managed Care – PPO

## 2014-06-01 ENCOUNTER — Encounter (HOSPITAL_BASED_OUTPATIENT_CLINIC_OR_DEPARTMENT_OTHER): Payer: 59 | Admitting: Hematology & Oncology

## 2014-06-01 ENCOUNTER — Encounter (HOSPITAL_COMMUNITY): Payer: Self-pay | Admitting: Hematology & Oncology

## 2014-06-01 VITALS — BP 164/69 | HR 80 | Temp 97.9°F | Resp 16 | Wt 163.6 lb

## 2014-06-01 DIAGNOSIS — C180 Malignant neoplasm of cecum: Secondary | ICD-10-CM

## 2014-06-01 LAB — CEA: CEA: 3.5 ng/mL (ref 0.0–4.7)

## 2014-06-01 NOTE — Patient Instructions (Signed)
[place] at [facility] Discharge Instructions  RECOMMENDATIONS MADE BY THE CONSULTANT AND ANY TEST RESULTS WILL BE SENT TO YOUR REFERRING PHYSICIAN.  Exam and discussion by Dr. Whitney Muse. Labs are stable. Will do scans to make sure nothing is going on in your lower abdomen.  We will call you if there are any concerns.  Follow-up in 6 months with labs and office visit.  Thank you for choosing [place] at [facility] to provide your oncology and hematology care.  To afford each patient quality time with our provider, please arrive at least 15 minutes before your scheduled appointment time.    You need to re-schedule your appointment should you arrive 10 or more minutes late.  We strive to give you quality time with our providers, and arriving late affects you and other patients whose appointments are after yours.  Also, if you no show three or more times for appointments you may be dismissed from the clinic at the providers discretion.     Again, thank you for choosing [facility].  Our hope is that these requests will decrease the amount of time that you wait before being seen by our physicians.       _____________________________________________________________  Should you have questions after your visit to [facility], please contact our office at [phone] between the hours of 8:30 a.m. and 4:30 p.m.  Voicemails left after 4:30 p.m. will not be returned until the following business day.  For prescription refill requests, have your pharmacy contact our office.

## 2014-06-06 ENCOUNTER — Encounter (HOSPITAL_COMMUNITY): Payer: Self-pay

## 2014-06-06 ENCOUNTER — Ambulatory Visit (HOSPITAL_COMMUNITY)
Admission: RE | Admit: 2014-06-06 | Discharge: 2014-06-06 | Disposition: A | Discharge status: Home / Self Care | Payer: 59 | Source: Ambulatory Visit | Attending: Hematology & Oncology | Admitting: Hematology & Oncology

## 2014-06-06 DIAGNOSIS — C180 Malignant neoplasm of cecum: Secondary | ICD-10-CM | POA: Insufficient documentation

## 2014-06-06 DIAGNOSIS — R1032 Left lower quadrant pain: Secondary | ICD-10-CM | POA: Insufficient documentation

## 2014-06-06 DIAGNOSIS — R918 Other nonspecific abnormal finding of lung field: Secondary | ICD-10-CM | POA: Insufficient documentation

## 2014-06-06 DIAGNOSIS — M5489 Other dorsalgia: Secondary | ICD-10-CM | POA: Diagnosis not present

## 2014-06-06 DIAGNOSIS — G8929 Other chronic pain: Secondary | ICD-10-CM | POA: Insufficient documentation

## 2014-06-06 MED ORDER — SODIUM CHLORIDE 0.9 % IJ SOLN
INTRAMUSCULAR | Status: AC
  Filled 2014-06-06: qty 45

## 2014-06-06 MED ORDER — SODIUM CHLORIDE 0.9 % IJ SOLN
INTRAMUSCULAR | Status: AC
  Filled 2014-06-06: qty 750

## 2014-06-06 MED ORDER — IOHEXOL 300 MG/ML  SOLN
100.0000 mL | Freq: Once | INTRAMUSCULAR | Status: AC | PRN
  Administered 2014-06-06: 100 mL via INTRAVENOUS

## 2014-06-13 ENCOUNTER — Encounter (HOSPITAL_COMMUNITY): Payer: BC Managed Care – PPO

## 2014-06-19 ENCOUNTER — Other Ambulatory Visit (HOSPITAL_COMMUNITY): Payer: Self-pay | Admitting: Oncology

## 2014-06-19 DIAGNOSIS — T148XXA Other injury of unspecified body region, initial encounter: Secondary | ICD-10-CM

## 2014-06-19 MED ORDER — CYCLOBENZAPRINE HCL 10 MG PO TABS: 10.0000 mg | ORAL_TABLET | Freq: Three times a day (TID) | ORAL | Status: DC | PRN

## 2014-06-22 ENCOUNTER — Other Ambulatory Visit (HOSPITAL_COMMUNITY): Payer: Self-pay | Admitting: Hematology & Oncology

## 2014-06-22 MED ORDER — METHYLPREDNISOLONE 4 MG PO TBPK: ORAL_TABLET | ORAL | Status: DC

## 2014-06-26 ENCOUNTER — Encounter (HOSPITAL_COMMUNITY): Payer: Self-pay | Admitting: Hematology & Oncology

## 2014-06-26 NOTE — Progress Notes (Signed)
Default, Provider, MD No address on file   DIAGNOSIS:  Stage III CRC, 8.4 cm cecal cancer 5/14 positive LN  R hemicolectomy in 2005 followed by XELOX/Avastin X 6 cycles Lung Nodule felt to be benign  SUMMARY OF ONCOLOGIC HISTORY:   Adenocarcinoma of cecum   09/01/2003 Definitive Surgery Right cecal mass identified as an infiltrating adenocarcinoma measuring 8.4 cm with tumor extending through the muscularis propria into pericolic fat, LVI not observed, but 5/14 positive lymph nodes   10/02/2003 - 01/15/2004 Chemotherapy Oxaliplatin, Xeloda , and Avastin (despite Stage III disease) x 6 cycles   01/04/2004 Remission CT CAP- no evidence of disease    CURRENT THERAPY:Observation  INTERVAL HISTORY: Vanessa [name] 59 y.o. female returns for follow-up of previously diagnosed stage III colon cancer. He was diagnosed and treated back in 2005. Complains today of pain in her lower stomach that radiates into the hip. He states it has been present now for several months. Her husband is ill and she is his caretaker. States she has been unable to get medical attention for the pain, but it has gotten to the point that she has finally come in for evaluation. He denies any nausea or vomiting. She denies any change in her appetite. She denies any change in her bowel habits.  She notes she is up-to-date with both screening colonoscopy and mammography.  MEDICAL HISTORY: Past Medical History  Diagnosis Date  . Anemia   . Peptic ulcer   . Colon cancer   . Gall bladder disease     inflammation  . Yeast infection involving the vagina and surrounding area   . Abdominal pain, other specified site   . Arthritis   . Fibroids   . Adenocarcinoma of cecum 01/15/2014    has Allergic rhinitis; Routine gynecological examination; and Adenocarcinoma of cecum on her problem list.     is allergic to oxycodone-acetaminophen.  [name] had no medications administered during this visit.  SURGICAL  HISTORY: Past Surgical History  Procedure Laterality Date  . Colon surgery    . Egd/tcs    . Bilateral nipple resections    . Prot-a-cath placement    . Colonoscopy    . Colonoscopy N/A 11/29/2013    Procedure: COLONOSCOPY;  Surgeon: Rogene Houston, MD;  Location: [facility];  Service: Endoscopy;  Laterality: N/A;  1030    SOCIAL HISTORY: History   Social History  . Marital Status: Married    Spouse Name: N/A  . Number of Children: N/A  . Years of Education: N/A   Occupational History  . Not on file.   Social History Main Topics  . Smoking status: Never Smoker   . Smokeless tobacco: Never Used  . Alcohol Use: No  . Drug Use: No  . Sexual Activity: Not Currently    Birth Control/ Protection: Post-menopausal   Other Topics Concern  . Not on file   Social History Narrative    FAMILY HISTORY: Family History  Problem Relation Age of Onset  . Cancer Mother     lung cancer  . Heart attack Brother   . Kidney disease Sister   . Thyroid nodules Daughter   . Fibroids Sister     Review of Systems  Constitutional: Positive for malaise/fatigue.  HENT: Negative.   Eyes: Negative.   Respiratory: Negative.   Cardiovascular: Negative.   Gastrointestinal: Positive for abdominal pain.  Genitourinary: Negative.   Musculoskeletal: Negative.   Skin: Negative.   Neurological: Negative.  Endo/Heme/Allergies: Negative.   Psychiatric/Behavioral: Negative.     PHYSICAL EXAMINATION  ECOG PERFORMANCE STATUS: 1 - Symptomatic but completely ambulatory  Filed Vitals:   06/01/14 1250  BP: 164/69  Pulse: 80  Temp: 97.9 F (36.6 C)  Resp: 16    Physical Exam  Constitutional: She is oriented to person, place, and time and well-developed, well-nourished, and in no distress.  HENT:  Head: Normocephalic and atraumatic.  Nose: Nose normal.  Mouth/Throat: Oropharynx is clear and moist. No oropharyngeal exudate.  Eyes: Conjunctivae and EOM are normal. Pupils are equal,  round, and reactive to light. Right eye exhibits no discharge. Left eye exhibits no discharge. No scleral icterus.  Neck: Normal range of motion. Neck supple. No tracheal deviation present. No thyromegaly present.  Cardiovascular: Normal rate, regular rhythm and normal heart sounds.  Exam reveals no gallop and no friction rub.   No murmur heard. Pulmonary/Chest: Effort normal and breath sounds normal. She has no wheezes. She has no rales.  Abdominal: Soft. Bowel sounds are normal. She exhibits no distension and no mass. There is tenderness. There is no rebound and no guarding.  Tenderness limited to the R lower abdomen  Musculoskeletal: Normal range of motion. She exhibits no edema.  Lymphadenopathy:    She has no cervical adenopathy.  Neurological: She is alert and oriented to person, place, and time. She has normal reflexes. No cranial nerve deficit. Gait normal. Coordination normal.  Skin: Skin is warm and dry. No rash noted.  Psychiatric: Mood, memory, affect and judgment normal.  Nursing note and vitals reviewed.   LABORATORY DATA:  CBC    Component Value Date/Time   WBC 6.6 05/31/2014 1305   RBC 4.62 05/31/2014 1305   HGB 12.6 05/31/2014 1305   HCT 39.3 05/31/2014 1305   PLT 243 05/31/2014 1305   MCV 85.1 05/31/2014 1305   MCH 27.3 05/31/2014 1305   MCHC 32.1 05/31/2014 1305   RDW 14.9 05/31/2014 1305   LYMPHSABS 2.6 05/31/2014 1305   MONOABS 0.4 05/31/2014 1305   EOSABS 0.1 05/31/2014 1305   BASOSABS 0.0 05/31/2014 1305   CMP     Component Value Date/Time   NA 141 05/31/2014 1305   K 3.6 05/31/2014 1305   CL 105 05/31/2014 1305   CO2 27 05/31/2014 1305   GLUCOSE 118* 05/31/2014 1305   BUN 23 05/31/2014 1305   CREATININE 0.88 05/31/2014 1305   CALCIUM 9.4 05/31/2014 1305   PROT 7.7 05/31/2014 1305   ALBUMIN 3.9 05/31/2014 1305   AST 28 05/31/2014 1305   ALT 33 05/31/2014 1305   ALKPHOS 77 05/31/2014 1305   BILITOT 0.6 05/31/2014 1305   GFRNONAA 70* 05/31/2014  1305   GFRAA 81* 05/31/2014 1305     COLONOSCOPY 11/29/2013  1. Diverticulum at the hepatic flexure 2. Single rectal polypablated via cold biopsy. RECOMMENDATIONS: Await biopsy results RECALL: Return in 4 years for Colonoscopy. Hildred Laser, MD eSigned: Hildred Laser, MD 11/29/2013 11:39 AM Diagnosis Rectum, polyp(s) - HYPERPLASTIC POLYP. NO ADENOMATOUS CHANGE OR MALIGNANCY Claudette Laws MD Pathologist, Electronic Signature (Case signed 11/30/2013)  RADIOGRAPHIC STUDIES: CLINICAL DATA: Screening.  EXAM: DIGITAL SCREENING BILATERAL MAMMOGRAM WITH CAD  COMPARISON: Previous exam(s).  ACR Breast Density Category c: The breast tissue is heterogeneously dense, which may obscure small masses.  FINDINGS: There are no findings suspicious for malignancy. Images were processed with CAD.  IMPRESSION: No mammographic evidence of malignancy. A result letter of this screening mammogram will be mailed directly to the patient.  RECOMMENDATION:  Screening mammogram in one year. (Code:SM-B-01Y)  BI-RADS CATEGORY 1: Negative.   Electronically Signed  By: Everlean Alstrom M.D.  On: 12/28/2013 07:55    ASSESSMENT and THERAPY PLAN:   History of Stage III CRC diagnosed in 2005 New abdominal pain of several months duration  I have recommended proceeding with CT abdomen and pelvis. We will also repeat a CT of the chest given her prior lung nodule to ensure stability. I reassured the patient that if her lung nodule was unchanged there is no additional need to follow up on future imaging studies. I am concerned about her abdominal pain and we will advise her the results of her CT of abdomen and pelvis when they are available.  She is up-to-date on screening mammography and colonoscopy. Laboratory studies today were reviewed with the patient and generally all within normal limits.  We will tentatively schedule her for one-year follow-up. Keep her apprised of the results of  her CT scans and they become available. Should there be any abnormality noted in the abdomen or pelvis I will bring her back in for additional follow-up and discussion.  All questions were answered. The patient knows to call the clinic with any problems, questions or concerns. We can certainly see the patient much sooner if necessary. This note was electronically signed. Molli Hazard, MD 06/26/2014

## 2014-07-12 ENCOUNTER — Other Ambulatory Visit (HOSPITAL_COMMUNITY): Payer: Self-pay | Admitting: Oncology

## 2014-09-23 ENCOUNTER — Other Ambulatory Visit (HOSPITAL_COMMUNITY): Payer: Self-pay | Admitting: Oncology

## 2014-09-24 ENCOUNTER — Telehealth (HOSPITAL_COMMUNITY): Payer: Self-pay

## 2014-09-24 NOTE — Telephone Encounter (Signed)
Patient notified.  Instructed that she needs to get PCP and they need to be managing her hypertension.  Verbalized understanding of instructions.

## 2014-09-24 NOTE — Telephone Encounter (Signed)
-----   Message from Baird Cancer, PA-C sent at 09/24/2014  4:04 PM EDT ----- I will refill her BP medication for the final time.  She has been told she needs a PCP in the past.  We do not manage HTN.    KEFALAS,THOMAS, PA-C

## 2014-10-31 ENCOUNTER — Ambulatory Visit (INDEPENDENT_AMBULATORY_CARE_PROVIDER_SITE_OTHER): Payer: 59 | Admitting: Obstetrics and Gynecology

## 2014-10-31 ENCOUNTER — Encounter: Payer: Self-pay | Admitting: Obstetrics and Gynecology

## 2014-10-31 VITALS — BP 140/90 | Ht 62.0 in | Wt 164.0 lb

## 2014-10-31 DIAGNOSIS — D259 Leiomyoma of uterus, unspecified: Secondary | ICD-10-CM | POA: Insufficient documentation

## 2014-10-31 DIAGNOSIS — N6452 Nipple discharge: Secondary | ICD-10-CM

## 2014-10-31 DIAGNOSIS — Z1211 Encounter for screening for malignant neoplasm of colon: Secondary | ICD-10-CM

## 2014-10-31 DIAGNOSIS — R1032 Left lower quadrant pain: Secondary | ICD-10-CM | POA: Insufficient documentation

## 2014-10-31 DIAGNOSIS — Z01419 Encounter for gynecological examination (general) (routine) without abnormal findings: Secondary | ICD-10-CM | POA: Diagnosis not present

## 2014-10-31 DIAGNOSIS — N950 Postmenopausal bleeding: Secondary | ICD-10-CM | POA: Insufficient documentation

## 2014-10-31 MED ORDER — TRAMADOL HCL 50 MG PO TABS: 50.0000 mg | ORAL_TABLET | Freq: Four times a day (QID) | ORAL | Status: AC | PRN

## 2014-10-31 NOTE — Progress Notes (Signed)
This chart was scribed for Jonnie Kind, MD by Erling Conte, ED Scribe. This patient was seen in room 1 and the patient's care was started at 9:05 AM.   Assessment:  Annual Gyn Exam Fibroid uterus LLq pain Left breast retraction   Plan:  1. pap smear done, next pap due 1 year 2. return annually or prn 3    Annual mammogram advised diagnostic due to breast changes 4.   Subjective:  OLYMPIA ADELSBERGER is a 59 y.o. female No obstetric history on file. who presents for annual exam. No LMP recorded. Patient is postmenopausal. The patient has complaints today of intermittent, vaginal spotting onset 3 months. She also reports intermittent pain from her uterine fibroids. Pt notes she has had a diagnostic mammogram in the past year. Pt had concerns because her left nipple was slightly more retracted than the other and that has progressed in the last yr. and she was having some mild discharge from her nipple last yr with negative CT chest . The mammogram resulted in no acute findings and she denies any ongoing discharge. Pt's last pap smear was 1 year ago. Pt is not sexually active at this time due to her husband having cancer. Pt denies hormone therapy treatment use.  The following portions of the patient's history were reviewed and updated as appropriate: allergies, current medications, past family history, past medical history, past social history, past surgical history and problem list. Past Medical History  Diagnosis Date  . Anemia   . Peptic ulcer   . Colon cancer   . Gall bladder disease     inflammation  . Yeast infection involving the vagina and surrounding area   . Abdominal pain, other specified site   . Arthritis   . Fibroids   . Adenocarcinoma of cecum 01/15/2014  . Hypertension     Past Surgical History  Procedure Laterality Date  . Colon surgery    . Egd/tcs    . Bilateral nipple resections    . Prot-a-cath placement    . Colonoscopy    . Colonoscopy N/A 11/29/2013   Procedure: COLONOSCOPY;  Surgeon: Rogene Houston, MD;  Location: [facility];  Service: Endoscopy;  Laterality: N/A;  1030     Current outpatient prescriptions:  .  aspirin 81 MG tablet, Take 81 mg by mouth daily., Disp: , Rfl:  .  Calcium Carbonate-Vitamin D (CALCIUM + D PO), Take by mouth daily.  , Disp: , Rfl:  .  Cinnamon 500 MG capsule, Take 500 mg by mouth daily., Disp: , Rfl:  .  cyclobenzaprine (FLEXERIL) 10 MG tablet, Take 1 tablet (10 mg total) by mouth 3 (three) times daily as needed for muscle spasms., Disp: 30 tablet, Rfl: 1 .  fish oil-omega-3 fatty acids 1000 MG capsule, Take 1 g by mouth daily.  , Disp: , Rfl:  .  fluticasone (FLONASE) 50 MCG/ACT nasal spray, Place 2 sprays into the nose as needed for allergies. , Disp: , Rfl:  .  ibuprofen (ADVIL,MOTRIN) 200 MG tablet, Take 200 mg by mouth daily as needed for moderate pain., Disp: , Rfl:  .  lisinopril-hydrochlorothiazide (PRINZIDE,ZESTORETIC) 20-12.5 MG per tablet, take 1 tablet by mouth once daily, Disp: 30 tablet, Rfl: 2 .  Multiple Vitamins-Minerals (CENTRUM SILVER PO), Take by mouth daily.  , Disp: , Rfl:  .  naproxen (NAPROSYN) 500 MG tablet, Take 1 tablet (500 mg total) by mouth 2 (two) times daily with a meal., Disp: 60 tablet, Rfl: 3 .  OVER THE COUNTER MEDICATION, Take 1 capsule by mouth daily. Tumeric, Disp: , Rfl:  .  potassium chloride (KLOR-CON M10) 10 MEQ tablet, Take 1 tablet (10 mEq total) by mouth 2 (two) times daily., Disp: 60 tablet, Rfl: 6 .  Simethicone (GAS RELIEF EXTRA STRENGTH PO), Take 1-2 tablets by mouth daily as needed (gas relief)., Disp: , Rfl:  .  traMADol (ULTRAM) 50 MG tablet, Take 1 tablet (50 mg total) by mouth every 6 (six) hours as needed. (Patient not taking: Reported on 10/31/2014), Disp: 60 tablet, Rfl: 0  Review of Systems Constitutional: negative Gastrointestinal: negative Genitourinary: positive for vaginal spotting and pelvic pain  Objective:  BP 140/90 mmHg  Ht 5\' 2"  (1.575 m)   Wt 164 lb (74.39 kg)  BMI 29.99 kg/m2   BMI: Body mass index is 29.99 kg/(m^2).  General Appearance: Alert, appropriate appearance for age. No acute distress Breast Exam: Right breast normal. Tip of nipple is more retracted on left than the right Gastrointestinal: Soft, non-tender, no masses or organomegaly Pelvic Exam:  Vaginal: good support and normal secretions Cervix: normal appearance and without lesions  Repeating pap due to question of vaginal spotting Rectovaginal: normal rectal, no masses and guaiac negative stool obtained Skin: no rash or abnormalities Neurologic: Normal gait and speech, no tremor  Psychiatric: Alert and oriented, appropriate affect.    Urinalysis:Not done  Mallory Shirk. MD Pgr [phone] 9:05 AM  I personally performed the services described in this documentation, which was SCRIBED in my presence. The recorded information has been reviewed and considered accurate. It has been edited as necessary during review. Jonnie Kind, MD

## 2014-10-31 NOTE — Progress Notes (Signed)
Patient ID: Vanessa [name], female   DOB: August 21, 1955, 59 y.o.   MRN: [id number] Pt here today for her annual exam. Pt states that she has pain from her fibroids and she has noticed some spotting on and off since June.

## 2014-11-14 ENCOUNTER — Other Ambulatory Visit: Payer: Self-pay | Admitting: Obstetrics and Gynecology

## 2014-11-14 DIAGNOSIS — N950 Postmenopausal bleeding: Secondary | ICD-10-CM

## 2014-11-15 ENCOUNTER — Ambulatory Visit (INDEPENDENT_AMBULATORY_CARE_PROVIDER_SITE_OTHER): Payer: 59

## 2014-11-15 DIAGNOSIS — N950 Postmenopausal bleeding: Secondary | ICD-10-CM | POA: Diagnosis not present

## 2014-11-15 NOTE — Progress Notes (Signed)
PELVIC US TA/TV: heterogenous anteverted uterus w/two fibroids (#1) post rt intramural fibroid 3.0 x 2.6 x 2.3cm,(#2) ant LUS subserosal fibroid 1.1 X 1 X .9cm,thickened EEC 6.13mm,normal ov's bilat, limited view of ov's w/ TV,small amount of cul de sac fluid,no pain during ultrasound.

## 2014-11-23 ENCOUNTER — Ambulatory Visit: Payer: 59 | Admitting: Obstetrics and Gynecology

## 2014-11-23 ENCOUNTER — Telehealth: Payer: Self-pay | Admitting: Obstetrics and Gynecology

## 2014-11-26 ENCOUNTER — Encounter: Payer: Self-pay | Admitting: Obstetrics and Gynecology

## 2014-11-26 ENCOUNTER — Ambulatory Visit (INDEPENDENT_AMBULATORY_CARE_PROVIDER_SITE_OTHER): Payer: 59 | Admitting: Obstetrics and Gynecology

## 2014-11-26 VITALS — BP 150/108 | Ht 63.0 in

## 2014-11-26 DIAGNOSIS — N6459 Other signs and symptoms in breast: Secondary | ICD-10-CM

## 2014-11-26 MED ORDER — LISINOPRIL 40 MG PO TABS: 40.0000 mg | ORAL_TABLET | Freq: Every day | ORAL | Status: DC

## 2014-11-26 NOTE — Addendum Note (Signed)
Addended by: Jonnie Kind on: 11/26/2014 02:31 PM   Modules accepted: Orders, Level of Service

## 2014-11-26 NOTE — Progress Notes (Signed)
Patient ID: Vanessa [name], female   DOB: 08/08/1955, 59 y.o.   MRN: [id number] Pt here today for results and breast exam. Pt states that she had some breast discharge from her left breast on the 21st of September.

## 2014-11-26 NOTE — Progress Notes (Signed)
Patient ID: Vanessa [name], female   DOB: Oct 09, 1955, 59 y.o.   MRN: [id number]   [place] [facility] Visit  Patient name: Vanessa [name] MRN [id number]  Date of birth: [birth year]  CC & HPI:  Vanessa [name] is a 59 y.o. female presenting today for rescheduled followup appt for breast concern of left nipple retraction and a single episode of apparent nipple d/c from a single duct, single episode.   ROS:  Pt had u/s pelvis 9/22 with a 8 mm endometrial stripe, slight thickened for postmenopausal state.  Pertinent History Reviewed:   Reviewed: Significant for colon cancer 2005. Negative f/u colonoscopy late 2015.  Pt still portacath 11 yr s/p cancer. Medical         Past Medical History  Diagnosis Date  . Anemia   . Peptic ulcer   . Colon cancer ([place])   . Gall bladder disease     inflammation  . Yeast infection involving the vagina and surrounding area   . Abdominal pain, other specified site   . Arthritis   . Fibroids   . Adenocarcinoma of cecum ([place]) 01/15/2014  . Hypertension                               Surgical Hx:    Past Surgical History  Procedure Laterality Date  . Colon surgery    . Egd/tcs    . Bilateral nipple resections    . Prot-a-cath placement    . Colonoscopy    . Colonoscopy N/A 11/29/2013    Procedure: COLONOSCOPY;  Surgeon: Rogene Houston, MD;  [facility];  Service: Endoscopy;  Laterality: N/A;  1030   Medications: Reviewed & Updated - see associated section                       Current outpatient prescriptions:  .  aspirin 81 MG tablet, Take 81 mg by mouth daily., Disp: , Rfl:  .  Calcium Carbonate-Vitamin D (CALCIUM + D PO), Take by mouth daily.  , Disp: , Rfl:  .  Cinnamon 500 MG capsule, Take 500 mg by mouth daily., Disp: , Rfl:  .  cyclobenzaprine (FLEXERIL) 10 MG tablet, Take 1 tablet (10 mg total) by mouth 3 (three) times daily as needed for muscle spasms., Disp: 30 tablet, Rfl: 1 .  fish oil-omega-3 fatty acids 1000 MG  capsule, Take 1 g by mouth daily.  , Disp: , Rfl:  .  fluticasone (FLONASE) 50 MCG/ACT nasal spray, Place 2 sprays into the nose as needed for allergies. , Disp: , Rfl:  .  ibuprofen (ADVIL,MOTRIN) 200 MG tablet, Take 200 mg by mouth daily as needed for moderate pain., Disp: , Rfl:  .  lisinopril-hydrochlorothiazide (PRINZIDE,ZESTORETIC) 20-12.5 MG per tablet, take 1 tablet by mouth once daily, Disp: 30 tablet, Rfl: 2 .  Multiple Vitamins-Minerals (CENTRUM SILVER PO), Take by mouth daily.  , Disp: , Rfl:  .  naproxen (NAPROSYN) 500 MG tablet, Take 1 tablet (500 mg total) by mouth 2 (two) times daily with a meal., Disp: 60 tablet, Rfl: 3 .  OVER THE COUNTER MEDICATION, Take 1 capsule by mouth daily. Tumeric, Disp: , Rfl:  .  potassium chloride (KLOR-CON M10) 10 MEQ tablet, Take 1 tablet (10 mEq total) by mouth 2 (two) times daily., Disp: 60 tablet, Rfl: 6 .  Simethicone (GAS RELIEF EXTRA STRENGTH PO), Take 1-2 tablets by  mouth daily as needed (gas relief)., Disp: , Rfl:  .  traMADol (ULTRAM) 50 MG tablet, Take 1 tablet (50 mg total) by mouth every 6 (six) hours as needed. (Patient not taking: Reported on 10/31/2014), Disp: 60 tablet, Rfl: 0 .  traMADol (ULTRAM) 50 MG tablet, Take 1 tablet (50 mg total) by mouth every 6 (six) hours as needed., Disp: 60 tablet, Rfl: 0   Social History: Reviewed -  reports that she has never smoked. She has never used smokeless tobacco.  Objective Findings:  Vitals: Blood pressure 150/108, height 5\' 3"  (1.6 m).  Physical Examination: Chest - clear to auscultation, no wheezes, rales or rhonchi, symmetric air entry Abdomen - soft, nontender, nondistended, no masses or organomegaly Breasts - breasts appear normal, no suspicious masses, no skin or nipple changes or axillary nodes, right breast normal without mass, skin or nipple changes or axillary nodes, left nipple is flat and seems to retract slightly with individual exam manipulation   Assessment & Plan:   A:  1.  Left breast nipple retraction 2. Questionable breast discharge x 1 3 hx ca colon right side 2005 4 retained Porta-cath x 11 yr 5 endometrial thickening to 5 mm  P:  1. diag mammo  2. Prolactin level 3 for endometrial biopsy

## 2014-11-30 ENCOUNTER — Encounter (HOSPITAL_COMMUNITY): Payer: Self-pay | Admitting: Hematology & Oncology

## 2014-11-30 ENCOUNTER — Encounter (HOSPITAL_COMMUNITY): Payer: 59 | Attending: Hematology & Oncology | Admitting: Hematology & Oncology

## 2014-11-30 ENCOUNTER — Encounter (HOSPITAL_COMMUNITY): Payer: 59

## 2014-11-30 VITALS — BP 194/88 | HR 93 | Temp 98.4°F | Resp 18 | Wt 162.6 lb

## 2014-11-30 DIAGNOSIS — I10 Essential (primary) hypertension: Secondary | ICD-10-CM

## 2014-11-30 DIAGNOSIS — C180 Malignant neoplasm of cecum: Secondary | ICD-10-CM

## 2014-11-30 DIAGNOSIS — R938 Abnormal findings on diagnostic imaging of other specified body structures: Secondary | ICD-10-CM | POA: Diagnosis not present

## 2014-11-30 DIAGNOSIS — Z85038 Personal history of other malignant neoplasm of large intestine: Secondary | ICD-10-CM | POA: Diagnosis not present

## 2014-11-30 LAB — COMPREHENSIVE METABOLIC PANEL
ALT: 26 U/L (ref 14–54)
AST: 30 U/L (ref 15–41)
Albumin: 4 g/dL (ref 3.5–5.0)
Alkaline Phosphatase: 74 U/L (ref 38–126)
Anion gap: 6 (ref 5–15)
BUN: 14 mg/dL (ref 6–20)
CO2: 28 mmol/L (ref 22–32)
Calcium: 9.4 mg/dL (ref 8.9–10.3)
Chloride: 104 mmol/L (ref 101–111)
Creatinine, Ser: 0.87 mg/dL (ref 0.44–1.00)
GFR calc non Af Amer: >60 mL/min (ref >60)
Glucose, Bld: 138 mg/dL — ABNORMAL HIGH (ref 65–99)
Potassium: 3.6 mmol/L (ref 3.5–5.1)
SODIUM: 138 mmol/L (ref 135–145)
Total Bilirubin: 0.6 mg/dL (ref 0.3–1.2)
Total Protein: 7.6 g/dL (ref 6.5–8.1)

## 2014-11-30 LAB — CBC WITH DIFFERENTIAL/PLATELET
Basophils Absolute: 0 10*3/uL (ref 0.0–0.1)
Basophils Relative: 1 %
EOS ABS: 0.1 10*3/uL (ref 0.0–0.7)
Eosinophils Relative: 2 %
HCT: 39 % (ref 36.0–46.0)
HEMOGLOBIN: 12.6 g/dL (ref 12.0–15.0)
LYMPHS ABS: 2.4 10*3/uL (ref 0.7–4.0)
LYMPHS PCT: 38 %
MCH: 26.8 pg (ref 26.0–34.0)
MCHC: 32.3 g/dL (ref 30.0–36.0)
MCV: 83 fL (ref 78.0–100.0)
Monocytes Absolute: 0.5 10*3/uL (ref 0.1–1.0)
Monocytes Relative: 9 %
NEUTROS ABS: 3.2 10*3/uL (ref 1.7–7.7)
NEUTROS PCT: 50 %
Platelets: 230 10*3/uL (ref 150–400)
RBC: 4.7 MIL/uL (ref 3.87–5.11)
RDW: 14.6 % (ref 11.5–15.5)
WBC: 6.2 10*3/uL (ref 4.0–10.5)

## 2014-11-30 NOTE — Patient Instructions (Signed)
[place] [facility] Discharge Instructions  RECOMMENDATIONS MADE BY THE CONSULTANT AND ANY TEST RESULTS WILL BE SENT TO YOUR REFERRING PHYSICIAN.  Exam and discussion by Dr. Whitney Muse Will let you know of any issues with your labs. Call with any concerns.  Follow-up in 1 year with labs and office visit  Thank you for choosing [place] at [facility] to provide your oncology and hematology care.  To afford each patient quality time with our provider, please arrive at least 15 minutes before your scheduled appointment time.    You need to re-schedule your appointment should you arrive 10 or more minutes late.  We strive to give you quality time with our providers, and arriving late affects you and other patients whose appointments are after yours.  Also, if you no show three or more times for appointments you may be dismissed from the clinic at the providers discretion.     Again, thank you for choosing [facility].  Our hope is that these requests will decrease the amount of time that you wait before being seen by our physicians.       _____________________________________________________________  Should you have questions after your visit to [facility], please contact our office at [phone] between the hours of 8:30 a.m. and 4:30 p.m.  Voicemails left after 4:30 p.m. will not be returned until the following business day.  For prescription refill requests, have your pharmacy contact our office.

## 2014-11-30 NOTE — Progress Notes (Signed)
Default, Provider, MD No address on file   DIAGNOSIS:  Stage III CRC, 8.4 cm cecal cancer 5/14 positive LN  R hemicolectomy in 2005 followed by XELOX/Avastin X 6 cycles Lung Nodule felt to be benign  SUMMARY OF ONCOLOGIC HISTORY:   Adenocarcinoma of cecum ([place])   09/01/2003 Definitive Surgery Right cecal mass identified as an infiltrating adenocarcinoma measuring 8.4 cm with tumor extending through the muscularis propria into pericolic fat, LVI not observed, but 5/14 positive lymph nodes   10/02/2003 - 01/15/2004 Chemotherapy Oxaliplatin, Xeloda , and Avastin (despite Stage III disease) x 6 cycles   01/04/2004 Remission CT CAP- no evidence of disease   10/27/2011 Imaging CT C/A/P without recurrent disease    CURRENT THERAPY:Observation  INTERVAL HISTORY: Vanessa [name] 59 y.o. female returns for follow-up of previously diagnosed stage III colon cancer. She was diagnosed and treated back in 2005. She notes she is up-to-date with both screening colonoscopy and mammography.   [name] is here alone today. She's recently had a cervical biopsy and mammogram. She has recently had her lisinopril increased for worsening of her HTN.  They discovered that the cause of her belly pain six months ago was fibroids.   She reports having some breast leakage on September the 21st. She's going in for a mammogram on the 11th. She says the discharge from her breast had a color; "it wasn't clear, but it wasn't bloody." She has been working with Dr. Glo Herring. She says she's also been spotting, but that's why she's having "endometrial stuff done."  She denies taking any kind of new supplement or new vitamins; only taking the medicines on her chart. She denies any new lumps or bumps.  She says she needs a vacation. She says once she gets all of her results in, she can calm down, but she says it's difficult to relax because no one can come and stay with her husband.  She does not have a PCP. She says  Dr. Tressie Stalker started her on her blood pressure medicine. Dr. Anette Riedel the one who just increased it. She notes Dr. Glo Herring manages a lot of her primary care issues.   MEDICAL HISTORY: Past Medical History  Diagnosis Date  . Anemia   . Peptic ulcer   . Colon cancer ([place])   . Gall bladder disease     inflammation  . Yeast infection involving the vagina and surrounding area   . Abdominal pain, other specified site   . Arthritis   . Fibroids   . Adenocarcinoma of cecum ([place]) 01/15/2014  . Hypertension     has Allergic rhinitis; Routine gynecological examination; Adenocarcinoma of cecum ([place]); Postmenopausal vaginal bleeding; Fibroid uterus; and Abdominal pain, left lower quadrant on her problem list.     is allergic to oxycodone-acetaminophen.  [name] had no medications administered during this visit.  SURGICAL HISTORY: Past Surgical History  Procedure Laterality Date  . Colon surgery    . Egd/tcs    . Bilateral nipple resections    . Prot-a-cath placement    . Colonoscopy    . Colonoscopy N/A 11/29/2013    Procedure: COLONOSCOPY;  Surgeon: Rogene Houston, MD;  [facility];  Service: Endoscopy;  Laterality: N/A;  1030    SOCIAL HISTORY: Social History   Social History  . Marital Status: Married    Spouse Name: N/A  . Number of Children: N/A  . Years of Education: N/A   Occupational History  . Not on  file.   Social History Main Topics  . Smoking status: Never Smoker   . Smokeless tobacco: Never Used  . Alcohol Use: No  . Drug Use: No  . Sexual Activity: Not Currently    Birth Control/ Protection: Post-menopausal   Other Topics Concern  . Not on file   Social History Narrative    FAMILY HISTORY: Family History  Problem Relation Age of Onset  . Cancer Mother     lung cancer  . Heart attack Brother   . Kidney disease Sister   . Thyroid nodules Daughter   . Fibroids Sister     Review of Systems  Constitutional: Negative. HENT:  Negative.   Eyes: Negative.   Respiratory: Negative.   Cardiovascular: Negative.   Gastrointestinal: Negative. Has fibroids. Genitourinary: Negative.   Musculoskeletal: Negative.   Skin: Negative.   Breast: Positive for nipple discharge. Neurological: Negative.   Endo/Heme/Allergies: Negative.   Psychiatric/Behavioral: Negative.    14 point review of systems was performed and is negative except as detailed under history of present illness and above  PHYSICAL EXAMINATION  ECOG PERFORMANCE STATUS: 1 - Symptomatic but completely ambulatory  Filed Vitals:   11/30/14 1234  BP: 194/88  Pulse: 93  Temp: 98.4 F (36.9 C)  Resp: 18    Physical Exam  Constitutional: She is oriented to person, place, and time and well-developed, well-nourished, and in no distress.  HENT:  Head: Normocephalic and atraumatic.  Nose: Nose normal.  Mouth/Throat: Oropharynx is clear and moist. No oropharyngeal exudate.  Eyes: Conjunctivae and EOM are normal. Pupils are equal, round, and reactive to light. Right eye exhibits no discharge. Left eye exhibits no discharge. No scleral icterus.  Neck: Normal range of motion. Neck supple. No tracheal deviation present. No thyromegaly present.  Cardiovascular: Normal rate, regular rhythm and normal heart sounds.  Exam reveals no gallop and no friction rub.   No murmur heard. Pulmonary/Chest: Effort normal and breath sounds normal. She has no wheezes. She has no rales.  Breast: Negative for No masses; nipple on left breast is "flattened" not inverted. No discharge expressed Abdominal: Soft. Bowel sounds are normal. She exhibits no distension and no mass. Negative for tenderness. There is no rebound and no guarding.  Musculoskeletal: Normal range of motion. She exhibits no edema.  Lymphadenopathy:    She has no cervical adenopathy.  Neurological: She is alert and oriented to person, place, and time. She has normal reflexes. No cranial nerve deficit. Gait normal.  Coordination normal.  Skin: Skin is warm and dry. No rash noted.  Psychiatric: Mood, memory, affect and judgment normal.  Nursing note and vitals reviewed.   LABORATORY DATA: I have reviewed the data as listed  CBC    Component Value Date/Time   WBC 6.2 11/30/2014 1238   RBC 4.70 11/30/2014 1238   HGB 12.6 11/30/2014 1238   HCT 39.0 11/30/2014 1238   PLT 230 11/30/2014 1238   MCV 83.0 11/30/2014 1238   MCH 26.8 11/30/2014 1238   MCHC 32.3 11/30/2014 1238   RDW 14.6 11/30/2014 1238   LYMPHSABS 2.4 11/30/2014 1238   MONOABS 0.5 11/30/2014 1238   EOSABS 0.1 11/30/2014 1238   BASOSABS 0.0 11/30/2014 1238   CMP     Component Value Date/Time   NA 138 11/30/2014 1238   K 3.6 11/30/2014 1238   CL 104 11/30/2014 1238   CO2 28 11/30/2014 1238   GLUCOSE 138* 11/30/2014 1238   BUN 14 11/30/2014 1238   CREATININE 0.87  11/30/2014 1238   CALCIUM 9.4 11/30/2014 1238   PROT 7.6 11/30/2014 1238   ALBUMIN 4.0 11/30/2014 1238   AST 30 11/30/2014 1238   ALT 26 11/30/2014 1238   ALKPHOS 74 11/30/2014 1238   BILITOT 0.6 11/30/2014 1238   GFRNONAA >60 11/30/2014 1238   GFRAA >60 11/30/2014 1238     COLONOSCOPY 11/29/2013  1. Diverticulum at the hepatic flexure 2. Single rectal polypablated via cold biopsy. RECOMMENDATIONS: Await biopsy results RECALL: Return in 4 years for Colonoscopy. Hildred Laser, MD eSigned: Hildred Laser, MD 11/29/2013 11:39 AM Diagnosis Rectum, polyp(s) - HYPERPLASTIC POLYP. NO ADENOMATOUS CHANGE OR MALIGNANCY Claudette Laws MD Pathologist, Electronic Signature (Case signed 11/30/2013)  RADIOGRAPHIC STUDIES: CLINICAL DATA: Screening.  EXAM: DIGITAL SCREENING BILATERAL MAMMOGRAM WITH CAD  COMPARISON: Previous exam(s).  ACR Breast Density Category c: The breast tissue is heterogeneously dense, which may obscure small masses.  FINDINGS: There are no findings suspicious for malignancy. Images were processed with CAD.  IMPRESSION: No  mammographic evidence of malignancy. A result letter of this screening mammogram will be mailed directly to the patient.  RECOMMENDATION: Screening mammogram in one year. (Code:SM-B-01Y)  BI-RADS CATEGORY 1: Negative.   Electronically Signed  By: Everlean Alstrom M.D.  On: 12/28/2013 07:55    ASSESSMENT and THERAPY PLAN:   History of Stage III CRC diagnosed in 2005 Nipple Discharge Hypertension Endometrial thickening  I have tried to encourage her to consider obtaining a primary care physician. I advised her that she needs someone to also follow cholesterol as well as her high blood pressure and other medical issues that she has had. She is to continue to follow with Dr. Glo Herring in regards to her nipple discharge, and endometrial thickening.  We will tentatively schedule her for one-year follow-up. In regards to her colon cancer there is no evidence of recurrent disease. Her diagnosis was over 10 years ago. I advised her she needs to move forward with screening colonoscopies as recommended by her GI physician.   Orders Placed This Encounter  Procedures  . CBC with Differential    Standing Status: Future     Number of Occurrences:      Standing Expiration Date: 11/30/2015  . Comprehensive metabolic panel    Standing Status: Future     Number of Occurrences:      Standing Expiration Date: 11/30/2015  . CEA    Standing Status: Future     Number of Occurrences:      Standing Expiration Date: 11/30/2015   All questions were answered. The patient knows to call the clinic with any problems, questions or concerns. We can certainly see the patient much sooner if necessary.  This document serves as a record of services personally performed by Ancil Linsey, MD. It was created on her behalf by Toni Amend, a trained medical scribe. The creation of this record is based on the scribe's personal observations and the provider's statements to them. This document has been  checked and approved by the attending provider.  This note was electronically signed.  Molli Hazard, MD  11/30/2014

## 2014-12-01 LAB — CEA: CEA: 3.2 ng/mL (ref 0.0–4.7)

## 2014-12-04 ENCOUNTER — Other Ambulatory Visit: Payer: Self-pay | Admitting: Obstetrics and Gynecology

## 2014-12-04 ENCOUNTER — Inpatient Hospital Stay
Admission: RE | Admit: 2014-12-04 | Discharge: 2014-12-04 | Disposition: A | Discharge status: Home / Self Care | Payer: Self-pay | Source: Ambulatory Visit | Attending: Obstetrics and Gynecology | Admitting: Obstetrics and Gynecology

## 2014-12-04 ENCOUNTER — Ambulatory Visit (HOSPITAL_COMMUNITY)
Admission: RE | Admit: 2014-12-04 | Discharge: 2014-12-04 | Disposition: A | Discharge status: Home / Self Care | Payer: 59 | Source: Ambulatory Visit | Attending: Obstetrics and Gynecology | Admitting: Obstetrics and Gynecology

## 2014-12-04 ENCOUNTER — Encounter (HOSPITAL_COMMUNITY): Payer: 59

## 2014-12-04 ENCOUNTER — Other Ambulatory Visit (HOSPITAL_COMMUNITY): Payer: Self-pay | Admitting: Cardiology

## 2014-12-04 DIAGNOSIS — N6459 Other signs and symptoms in breast: Secondary | ICD-10-CM

## 2014-12-04 DIAGNOSIS — N6452 Nipple discharge: Secondary | ICD-10-CM

## 2014-12-05 ENCOUNTER — Encounter (HOSPITAL_COMMUNITY): Payer: Self-pay | Admitting: Hematology & Oncology

## 2014-12-11 ENCOUNTER — Other Ambulatory Visit: Payer: Self-pay | Admitting: Obstetrics and Gynecology

## 2014-12-11 ENCOUNTER — Encounter: Payer: Self-pay | Admitting: Obstetrics and Gynecology

## 2014-12-11 ENCOUNTER — Ambulatory Visit (INDEPENDENT_AMBULATORY_CARE_PROVIDER_SITE_OTHER): Payer: 59 | Admitting: Obstetrics and Gynecology

## 2014-12-11 VITALS — BP 200/98 | Ht 63.0 in | Wt 164.0 lb

## 2014-12-11 DIAGNOSIS — I10 Essential (primary) hypertension: Secondary | ICD-10-CM | POA: Diagnosis not present

## 2014-12-11 DIAGNOSIS — R938 Abnormal findings on diagnostic imaging of other specified body structures: Secondary | ICD-10-CM | POA: Diagnosis not present

## 2014-12-11 DIAGNOSIS — R9389 Abnormal findings on diagnostic imaging of other specified body structures: Secondary | ICD-10-CM

## 2014-12-11 DIAGNOSIS — N6459 Other signs and symptoms in breast: Secondary | ICD-10-CM

## 2014-12-11 MED ORDER — AMLODIPINE BESYLATE 10 MG PO TABS: 10.0000 mg | ORAL_TABLET | Freq: Every day | ORAL | Status: DC

## 2014-12-11 MED ORDER — DICLOXACILLIN SODIUM 250 MG PO CAPS: 250.0000 mg | ORAL_CAPSULE | Freq: Four times a day (QID) | ORAL | Status: DC

## 2014-12-11 NOTE — Telephone Encounter (Signed)
Pt aware of results.  Pt had appt today for endometrial biopsy.

## 2014-12-11 NOTE — Progress Notes (Signed)
Patient ID: EARLEEN AOUN, female   DOB: 18-May-1955, 59 y.o.   MRN: [id number]   [place] [facility] Visit  Patient name: TERRYL NIZIOLEK MRN [id number]  Date of birth: January 14, 1956  CC & HPI:  MAYLEN WALTERMIRE is a 59 y.o. female presenting today for  1. followup of breast discharge 2. Endometrial biopsy 3 followup HTN  ROS:  Pt had breast u/s and had d/c c/w infection, see u/s note  Pertinent History Reviewed:   Reviewed: Significant for distracted speech. Medical         Past Medical History  Diagnosis Date  . Anemia   . Peptic ulcer   . Colon cancer ([place])   . Gall bladder disease     inflammation  . Yeast infection involving the vagina and surrounding area   . Abdominal pain, other specified site   . Arthritis   . Fibroids   . Adenocarcinoma of cecum ([place]) 01/15/2014  . Hypertension                               Surgical Hx:    Past Surgical History  Procedure Laterality Date  . Colon surgery    . Egd/tcs    . Bilateral nipple resections    . Prot-a-cath placement    . Colonoscopy    . Colonoscopy N/A 11/29/2013    Procedure: COLONOSCOPY;  Surgeon: Rogene Houston, MD;  [facility];  Service: Endoscopy;  Laterality: N/A;  1030   Medications: Reviewed & Updated - see associated section                       Current outpatient prescriptions:  .  aspirin 81 MG tablet, Take 81 mg by mouth daily., Disp: , Rfl:  .  Calcium Carbonate-Vitamin D (CALCIUM + D PO), Take by mouth daily.  , Disp: , Rfl:  .  Cinnamon 500 MG capsule, Take 500 mg by mouth daily., Disp: , Rfl:  .  cyclobenzaprine (FLEXERIL) 10 MG tablet, Take 1 tablet (10 mg total) by mouth 3 (three) times daily as needed for muscle spasms., Disp: 30 tablet, Rfl: 1 .  dicloxacillin (DYNAPEN) 250 MG capsule, Take 1 capsule (250 mg total) by mouth 4 (four) times daily., Disp: 40 capsule, Rfl: 0 .  fish oil-omega-3 fatty acids 1000 MG capsule, Take 1 g by mouth daily.  , Disp: , Rfl:  .  fluticasone  (FLONASE) 50 MCG/ACT nasal spray, Place 2 sprays into the nose as needed for allergies. , Disp: , Rfl:  .  ibuprofen (ADVIL,MOTRIN) 200 MG tablet, Take 200 mg by mouth daily as needed for moderate pain., Disp: , Rfl:  .  lisinopril (PRINIVIL,ZESTRIL) 40 MG tablet, Take 1 tablet (40 mg total) by mouth daily., Disp: 30 tablet, Rfl: 2 .  Multiple Vitamins-Minerals (CENTRUM SILVER PO), Take by mouth daily.  , Disp: , Rfl:  .  naproxen (NAPROSYN) 500 MG tablet, Take 1 tablet (500 mg total) by mouth 2 (two) times daily with a meal. (Patient not taking: Reported on 11/30/2014), Disp: 60 tablet, Rfl: 3 .  OVER THE COUNTER MEDICATION, Take 1 capsule by mouth daily. Tumeric, Disp: , Rfl:  .  potassium chloride (KLOR-CON M10) 10 MEQ tablet, Take 1 tablet (10 mEq total) by mouth 2 (two) times daily. (Patient not taking: Reported on 11/30/2014), Disp: 60 tablet, Rfl: 6 .  Simethicone (GAS RELIEF EXTRA  STRENGTH PO), Take 1-2 tablets by mouth daily as needed (gas relief)., Disp: , Rfl:  .  traMADol (ULTRAM) 50 MG tablet, Take 1 tablet (50 mg total) by mouth every 6 (six) hours as needed., Disp: 60 tablet, Rfl: 0   Social History: Reviewed -  reports that she has never smoked. She has never used smokeless tobacco.  Objective Findings:  Vitals: Blood pressure 200/98, height 5\' 3"  (1.6 m), weight 164 lb (74.39 kg).  Physical Examination: General appearance - alert, well appearing, and in no distress, oriented to person, place, and time, overweight and anxious Mental status - alert, oriented to person, place, and time, normal mood, behavior, speech, dress, motor activity, and thought processes, anxious Eyes - pupils equal and reactive, extraocular eye movements intact Abdomen - soft, nontender, nondistended, no masses or organomegaly Pelvic - normal external genitalia, vulva, vagina, cervix, uterus and adnexa, VULVA: normal appearing vulva with no masses, tenderness or lesions, VAGINA: normal appearing vagina with  normal color and discharge, no lesions, CERVIX: normal appearing cervix without discharge or lesions, UTERUS: uterus is normal size, shape, consistency and nontender, enlarged to 8 week's size Extremities - peripheral pulses normal, no pedal edema, no clubbing or cyanosis   Assessment & Plan:   A:  1. Uncontrolled systolic HTN 2. Endometrial thickening  -endomet bx done  1.   P:  1. rx dicloxacillin x 10 d 2. Add amylodipine 10 q d 3. Refer to dr Sim;pson attempted 4. F/u bx rechk 4 wk

## 2014-12-11 NOTE — Progress Notes (Signed)
Patient ID: Vanessa [name], female   DOB: 10/03/1955, 59 y.o.   MRN: [id number] Pt here today for an endometrial biopsy. Procedure explained and consent signed.

## 2014-12-12 ENCOUNTER — Encounter (HOSPITAL_COMMUNITY): Payer: 59

## 2014-12-18 ENCOUNTER — Telehealth: Payer: Self-pay | Admitting: *Deleted

## 2014-12-18 NOTE — Telephone Encounter (Signed)
Pt called stating that she is unsure of what medication to take.Dr. Glo Herring changed the pts BP meds at her last visit to Norvasc and Lisinopril. Pt states that she is having some swelling and needs a fluid pill.    I spoke with Dr. Glo Herring about above and he advised that the pt needs to stop taking the 40mg  Lisinopril and double her Lisinopril with HCTZ.   I tried contacting the pt and was unable to reach her and let her know what Dr. Glo Herring said.

## 2014-12-19 ENCOUNTER — Encounter: Payer: Self-pay | Admitting: *Deleted

## 2014-12-19 NOTE — Progress Notes (Signed)
Patient ID: Vanessa [name], female   DOB: January 28, 1956, 59 y.o.   MRN: [id number] Pt walked into the office today stating she has missed several phone calls from our office. Pt requesting clarification on blood pressure medication. Pt informed per Dr. Glo Herring to take two tablets of Lisinipril/HCTZ 20mg /12.5mg  po daily and to continue Norvasc 10 mg daily. Pt verbalized understanding.

## 2014-12-21 ENCOUNTER — Telehealth: Payer: Self-pay | Admitting: *Deleted

## 2014-12-21 ENCOUNTER — Other Ambulatory Visit (HOSPITAL_COMMUNITY): Payer: Self-pay | Admitting: Hematology & Oncology

## 2014-12-21 MED ORDER — POTASSIUM CHLORIDE CRYS ER 10 MEQ PO TBCR: 10.0000 meq | EXTENDED_RELEASE_TABLET | Freq: Two times a day (BID) | ORAL | Status: DC

## 2014-12-21 MED ORDER — LISINOPRIL-HYDROCHLOROTHIAZIDE 20-12.5 MG PO TABS: 2.0000 | ORAL_TABLET | Freq: Every day | ORAL | Status: DC

## 2014-12-21 NOTE — Telephone Encounter (Signed)
Dr. Glo Herring gave a verbal order for Lisinopril-HCTZ 20mg -12.5mg  take 2 tablets daily disp 60 with 6 refills to the pt's pharmacy. Pt on the phone when Rx was sent.

## 2015-01-07 ENCOUNTER — Telehealth: Payer: Self-pay | Admitting: Family Medicine

## 2015-01-07 NOTE — Telephone Encounter (Signed)
Opened in Error.

## 2015-01-09 ENCOUNTER — Ambulatory Visit: Payer: 59 | Admitting: Obstetrics and Gynecology

## 2015-02-09 ENCOUNTER — Encounter (HOSPITAL_COMMUNITY): Payer: Self-pay | Admitting: Emergency Medicine

## 2015-02-09 ENCOUNTER — Inpatient Hospital Stay (HOSPITAL_COMMUNITY)
Admission: EM | Admit: 2015-02-09 | Discharge: 2015-02-12 | DRG: 638 | Disposition: A | Discharge status: Home / Self Care | Payer: 59 | Attending: Internal Medicine | Admitting: Internal Medicine

## 2015-02-09 ENCOUNTER — Emergency Department (HOSPITAL_COMMUNITY): Payer: 59

## 2015-02-09 DIAGNOSIS — Z8249 Family history of ischemic heart disease and other diseases of the circulatory system: Secondary | ICD-10-CM | POA: Diagnosis not present

## 2015-02-09 DIAGNOSIS — Z85038 Personal history of other malignant neoplasm of large intestine: Secondary | ICD-10-CM | POA: Diagnosis not present

## 2015-02-09 DIAGNOSIS — Z801 Family history of malignant neoplasm of trachea, bronchus and lung: Secondary | ICD-10-CM

## 2015-02-09 DIAGNOSIS — I10 Essential (primary) hypertension: Secondary | ICD-10-CM | POA: Diagnosis present

## 2015-02-09 DIAGNOSIS — B370 Candidal stomatitis: Secondary | ICD-10-CM | POA: Diagnosis present

## 2015-02-09 DIAGNOSIS — M1990 Unspecified osteoarthritis, unspecified site: Secondary | ICD-10-CM | POA: Diagnosis present

## 2015-02-09 DIAGNOSIS — N179 Acute kidney failure, unspecified: Secondary | ICD-10-CM | POA: Diagnosis present

## 2015-02-09 DIAGNOSIS — E1110 Type 2 diabetes mellitus with ketoacidosis without coma: Secondary | ICD-10-CM | POA: Diagnosis present

## 2015-02-09 DIAGNOSIS — E870 Hyperosmolality and hypernatremia: Secondary | ICD-10-CM | POA: Diagnosis present

## 2015-02-09 DIAGNOSIS — E1310 Other specified diabetes mellitus with ketoacidosis without coma: Secondary | ICD-10-CM | POA: Diagnosis present

## 2015-02-09 DIAGNOSIS — Z841 Family history of disorders of kidney and ureter: Secondary | ICD-10-CM

## 2015-02-09 DIAGNOSIS — Z7982 Long term (current) use of aspirin: Secondary | ICD-10-CM | POA: Diagnosis not present

## 2015-02-09 DIAGNOSIS — E119 Type 2 diabetes mellitus without complications: Secondary | ICD-10-CM

## 2015-02-09 DIAGNOSIS — R7989 Other specified abnormal findings of blood chemistry: Secondary | ICD-10-CM

## 2015-02-09 DIAGNOSIS — I248 Other forms of acute ischemic heart disease: Secondary | ICD-10-CM | POA: Diagnosis present

## 2015-02-09 DIAGNOSIS — R778 Other specified abnormalities of plasma proteins: Secondary | ICD-10-CM | POA: Diagnosis present

## 2015-02-09 DIAGNOSIS — E860 Dehydration: Secondary | ICD-10-CM | POA: Diagnosis present

## 2015-02-09 DIAGNOSIS — R531 Weakness: Secondary | ICD-10-CM | POA: Diagnosis present

## 2015-02-09 LAB — COMPREHENSIVE METABOLIC PANEL
ALBUMIN: 4.9 g/dL (ref 3.5–5.0)
ALK PHOS: 118 U/L (ref 38–126)
ALT: 31 U/L (ref 14–54)
AST: 28 U/L (ref 15–41)
Anion gap: 29 — ABNORMAL HIGH (ref 5–15)
BUN: 53 mg/dL — AB (ref 6–20)
CHLORIDE: 96 mmol/L — AB (ref 101–111)
CO2: 18 mmol/L — AB (ref 22–32)
CREATININE: 3.08 mg/dL — AB (ref 0.44–1.00)
Calcium: 10.5 mg/dL — ABNORMAL HIGH (ref 8.9–10.3)
GFR calc Af Amer: 18 mL/min — ABNORMAL LOW (ref >60)
GFR calc non Af Amer: 15 mL/min — ABNORMAL LOW (ref >60)
GLUCOSE: 1384 mg/dL — AB (ref 65–99)
Potassium: 4.2 mmol/L (ref 3.5–5.1)
SODIUM: 143 mmol/L (ref 135–145)
Total Bilirubin: 0.9 mg/dL (ref 0.3–1.2)
Total Protein: 9.3 g/dL — ABNORMAL HIGH (ref 6.5–8.1)

## 2015-02-09 LAB — BASIC METABOLIC PANEL
ANION GAP: 21 — AB (ref 5–15)
BUN: 50 mg/dL — AB (ref 6–20)
CALCIUM: 9.1 mg/dL (ref 8.9–10.3)
CO2: 18 mmol/L — ABNORMAL LOW (ref 22–32)
Chloride: 115 mmol/L — ABNORMAL HIGH (ref 101–111)
Creatinine, Ser: 2.47 mg/dL — ABNORMAL HIGH (ref 0.44–1.00)
GFR calc Af Amer: 23 mL/min — ABNORMAL LOW (ref >60)
GFR, EST NON AFRICAN AMERICAN: 20 mL/min — AB (ref >60)
GLUCOSE: 963 mg/dL — AB (ref 65–99)
Potassium: 3.8 mmol/L (ref 3.5–5.1)
SODIUM: 154 mmol/L — AB (ref 135–145)

## 2015-02-09 LAB — CBC
HCT: 43.8 % (ref 36.0–46.0)
HEMOGLOBIN: 14 g/dL (ref 12.0–15.0)
MCH: 28.2 pg (ref 26.0–34.0)
MCHC: 32 g/dL (ref 30.0–36.0)
MCV: 88.1 fL (ref 78.0–100.0)
PLATELETS: 228 10*3/uL (ref 150–400)
RBC: 4.97 MIL/uL (ref 3.87–5.11)
RDW: 15.1 % (ref 11.5–15.5)
WBC: 13 10*3/uL — ABNORMAL HIGH (ref 4.0–10.5)

## 2015-02-09 LAB — GLUCOSE, CAPILLARY
GLUCOSE-CAPILLARY: 297 mg/dL — AB (ref 65–99)
Glucose-Capillary: >600 mg/dL (ref 65–99)
Glucose-Capillary: 459 mg/dL — ABNORMAL HIGH (ref 65–99)
Glucose-Capillary: 385 mg/dL — ABNORMAL HIGH (ref 65–99)

## 2015-02-09 LAB — CBC WITH DIFFERENTIAL/PLATELET
BASOS ABS: 0 10*3/uL (ref 0.0–0.1)
Basophils Relative: 0 %
EOS ABS: 0 10*3/uL (ref 0.0–0.7)
EOS PCT: 0 %
HCT: 53.5 % — ABNORMAL HIGH (ref 36.0–46.0)
HEMOGLOBIN: 15.8 g/dL — AB (ref 12.0–15.0)
Lymphocytes Relative: 5 %
Lymphs Abs: 0.7 10*3/uL (ref 0.7–4.0)
MCH: 28.2 pg (ref 26.0–34.0)
MCHC: 29.5 g/dL — ABNORMAL LOW (ref 30.0–36.0)
MCV: 95.5 fL (ref 78.0–100.0)
Monocytes Absolute: 1.1 10*3/uL — ABNORMAL HIGH (ref 0.1–1.0)
Monocytes Relative: 8 %
NEUTROS PCT: 87 %
Neutro Abs: 11.8 10*3/uL — ABNORMAL HIGH (ref 1.7–7.7)
PLATELETS: 295 10*3/uL (ref 150–400)
RBC: 5.6 MIL/uL — AB (ref 3.87–5.11)
RDW: 15.5 % (ref 11.5–15.5)
WBC: 13.6 10*3/uL — AB (ref 4.0–10.5)

## 2015-02-09 LAB — URINALYSIS, ROUTINE W REFLEX MICROSCOPIC
Bilirubin Urine: NEGATIVE
Ketones, ur: 15 mg/dL — AB
LEUKOCYTES UA: NEGATIVE
Nitrite: NEGATIVE
PH: 5.5 (ref 5.0–8.0)
PROTEIN: 30 mg/dL — AB
SPECIFIC GRAVITY, URINE: 1.01 (ref 1.005–1.030)

## 2015-02-09 LAB — URINE MICROSCOPIC-ADD ON
Squamous Epithelial / LPF: NONE SEEN
WBC, UA: NONE SEEN WBC/hpf (ref 0–5)

## 2015-02-09 LAB — CBG MONITORING, ED: Glucose-Capillary: >600 mg/dL (ref 65–99)

## 2015-02-09 LAB — MRSA PCR SCREENING: MRSA BY PCR: NEGATIVE

## 2015-02-09 LAB — TROPONIN I
TROPONIN I: 0.04 ng/mL — AB (ref <0.031)
Troponin I: 0.05 ng/mL — ABNORMAL HIGH (ref <0.031)

## 2015-02-09 MED ORDER — POTASSIUM CHLORIDE 10 MEQ/100ML IV SOLN
10.0000 meq | INTRAVENOUS | Status: AC
  Administered 2015-02-09 (×2): 10 meq via INTRAVENOUS
  Filled 2015-02-09 (×2): qty 100

## 2015-02-09 MED ORDER — SODIUM CHLORIDE 0.9 % IV BOLUS (SEPSIS)
1000.0000 mL | Freq: Once | INTRAVENOUS | Status: AC
  Administered 2015-02-09: 1000 mL via INTRAVENOUS

## 2015-02-09 MED ORDER — TRAMADOL HCL 50 MG PO TABS
50.0000 mg | ORAL_TABLET | Freq: Four times a day (QID) | ORAL | Status: DC | PRN
  Administered 2015-02-09 – 2015-02-11 (×5): 50 mg via ORAL
  Filled 2015-02-09 (×5): qty 1

## 2015-02-09 MED ORDER — DEXTROSE-NACL 5-0.45 % IV SOLN
INTRAVENOUS | Status: DC
  Administered 2015-02-10: 06:00:00 via INTRAVENOUS

## 2015-02-09 MED ORDER — SODIUM CHLORIDE 0.9 % IV SOLN
INTRAVENOUS | Status: AC
  Filled 2015-02-09: qty 2.5

## 2015-02-09 MED ORDER — INSULIN REGULAR HUMAN 100 UNIT/ML IJ SOLN
INTRAMUSCULAR | Status: DC
  Administered 2015-02-09: 5.4 [IU]/h via INTRAVENOUS
  Filled 2015-02-09: qty 2.5

## 2015-02-09 MED ORDER — ASPIRIN 81 MG PO CHEW
81.0000 mg | CHEWABLE_TABLET | Freq: Every day | ORAL | Status: DC
  Administered 2015-02-10 – 2015-02-12 (×3): 81 mg via ORAL
  Filled 2015-02-09 (×3): qty 1

## 2015-02-09 MED ORDER — ONDANSETRON HCL 4 MG/2ML IJ SOLN
4.0000 mg | Freq: Four times a day (QID) | INTRAMUSCULAR | Status: DC | PRN
  Administered 2015-02-09: 4 mg via INTRAVENOUS
  Filled 2015-02-09: qty 2

## 2015-02-09 MED ORDER — ENOXAPARIN SODIUM 30 MG/0.3ML ~~LOC~~ SOLN
30.0000 mg | SUBCUTANEOUS | Status: DC
  Administered 2015-02-10: 30 mg via SUBCUTANEOUS
  Filled 2015-02-09: qty 0.3

## 2015-02-09 MED ORDER — SODIUM CHLORIDE 0.9 % IV SOLN
INTRAVENOUS | Status: AC
Start: 2015-02-09 — End: 2015-02-09
  Administered 2015-02-09: 19:00:00 via INTRAVENOUS

## 2015-02-09 MED ORDER — SODIUM CHLORIDE 0.9 % IV SOLN
INTRAVENOUS | Status: DC
  Administered 2015-02-09: 19:00:00 via INTRAVENOUS

## 2015-02-09 MED ORDER — SODIUM CHLORIDE 0.9 % IV SOLN
INTRAVENOUS | Status: DC
  Filled 2015-02-09: qty 2.5

## 2015-02-09 NOTE — H&P (Signed)
[facility] History and Physical  Vanessa [name] [id number] DOB: 07/18/1955 DOA: 02/09/2015  Referring physician: Dr. Christy Gentles PCP: Default, Provider, MD   Chief Complaint: weakness  HPI: Vanessa [name] is a 59 y.o. female with a history of hypertension who presents to hospital with complaints of generalized weakness. She reports over the past week she's developed worsening generalized weakness. She's been noticing that she is increasingly thirsty and drinking plenty of fluids. She's also had increased urination. She describes some weight loss over the past 2 weeks. She is chronically short of breath and feels that this has not changed. She's not had a fever or cough. She does describe some vague chest pain that occurred over the past 2 days. She has had nausea but no vomiting. She denies any stomach cramps. She denies any dysuria. She was evaluated in the emergency room where she was noted to be severely hyperglycemic with a blood sugar over 1300. She has evidence of acute renal failure diabetic ketoacidosis. She is admitted for further treatments.   Review of Systems:  Pertinent positives as per HPI, otherwise negative  Past Medical History  Diagnosis Date  . Anemia   . Peptic ulcer   . Colon cancer ([place])   . Gall bladder disease     inflammation  . Yeast infection involving the vagina and surrounding area   . Abdominal pain, other specified site   . Arthritis   . Fibroids   . Adenocarcinoma of cecum ([place]) 01/15/2014  . Hypertension    Past Surgical History  Procedure Laterality Date  . Colon surgery    . Egd/tcs    . Bilateral nipple resections    . Prot-a-cath placement    . Colonoscopy    . Colonoscopy N/A 11/29/2013    Procedure: COLONOSCOPY;  Surgeon: Rogene Houston, MD;  [facility];  Service: Endoscopy;  Laterality: N/A;  1030   Social History:  reports that she has never smoked. She has never used smokeless tobacco. She reports that she does  not drink alcohol or use illicit drugs.  Allergies  Allergen Reactions  . Oxycodone-Acetaminophen     "burning up"     Family History  Problem Relation Age of Onset  . Cancer Mother     lung cancer  . Heart attack Brother   . Kidney disease Sister   . Thyroid nodules Daughter   . Fibroids Sister     Prior to Admission medications   Medication Sig Start Date End Date Taking? Authorizing Provider  amLODipine (NORVASC) 10 MG tablet Take 1 tablet (10 mg total) by mouth daily. 12/11/14  Yes Jonnie Kind, MD  aspirin 81 MG tablet Take 81 mg by mouth daily.   Yes Historical Provider, MD  Calcium Carbonate-Vitamin D (CALCIUM + D PO) Take by mouth daily.     Yes Historical Provider, MD  fish oil-omega-3 fatty acids 1000 MG capsule Take 1 g by mouth daily.     Yes Historical Provider, MD  fluticasone (FLONASE) 50 MCG/ACT nasal spray Place 2 sprays into the nose as needed for allergies.    Yes Historical Provider, MD  ibuprofen (ADVIL,MOTRIN) 200 MG tablet Take 200 mg by mouth daily as needed for moderate pain.   Yes Historical Provider, MD  lisinopril-hydrochlorothiazide (PRINZIDE,ZESTORETIC) 20-12.5 MG tablet Take 2 tablets by mouth daily. Patient taking differently: Take 2 tablets by mouth every other day.  12/21/14  Yes Jonnie Kind, MD  Multiple Vitamins-Minerals (CENTRUM SILVER PO) Take  by mouth daily.     Yes Historical Provider, MD  OVER THE COUNTER MEDICATION Take 1 capsule by mouth daily. Tumeric   Yes Historical Provider, MD  potassium chloride (KLOR-CON M10) 10 MEQ tablet Take 1 tablet (10 mEq total) by mouth 2 (two) times daily. 12/21/14  Yes Patrici Ranks, MD  Simethicone (GAS RELIEF EXTRA STRENGTH PO) Take 1-2 tablets by mouth daily as needed (gas relief).   Yes Historical Provider, MD  traMADol (ULTRAM) 50 MG tablet Take 1 tablet (50 mg total) by mouth every 6 (six) hours as needed. 10/31/14  Yes Jonnie Kind, MD   Physical Exam: Filed Vitals:   02/09/15 1600  02/09/15 1700 02/09/15 1730 02/09/15 1800  BP: 118/90 133/77 127/70   Pulse: 137 122 124 116  Temp:      TempSrc:      Resp: 20 18 19 22   Height:   5\' 5"  (1.651 m)   Weight:   65.5 kg (144 lb 6.4 oz)   SpO2: 96% 95% 100% 98%    Wt Readings from Last 3 Encounters:  02/09/15 65.5 kg (144 lb 6.4 oz)  12/11/14 74.39 kg (164 lb)  11/30/14 73.755 kg (162 lb 9.6 oz)    General:  Appears calm and comfortable Eyes: PERRL, normal lids, irises & conjunctiva ENT: dry mucous membranes Neck: no LAD, masses or thyromegaly Cardiovascular: s1, s2, tachycardic, no m/r/g. No LE edema. Telemetry: SR, no arrhythmias  Respiratory: CTA bilaterally, no w/r/r. Normal respiratory effort. Abdomen: soft, ntnd Skin: no rash or induration seen on limited exam Musculoskeletal: grossly normal tone BUE/BLE Psychiatric: grossly normal mood and affect, speech fluent and appropriate Neurologic: grossly non-focal.          Labs on Admission:  Basic Metabolic Panel:  Recent Labs Lab 02/09/15 1549  NA 143  K 4.2  CL 96*  CO2 18*  GLUCOSE 1384*  BUN 53*  CREATININE 3.08*  CALCIUM 10.5*   Liver Function Tests:  Recent Labs Lab 02/09/15 1549  AST 28  ALT 31  ALKPHOS 118  BILITOT 0.9  PROT 9.3*  ALBUMIN 4.9   No results for input(s): LIPASE, AMYLASE in the last 168 hours. No results for input(s): AMMONIA in the last 168 hours. CBC:  Recent Labs Lab 02/09/15 1549  WBC 13.6*  NEUTROABS 11.8*  HGB 15.8*  HCT 53.5*  MCV 95.5  PLT 295   Cardiac Enzymes:  Recent Labs Lab 02/09/15 1549  TROPONINI 0.04*    BNP (last 3 results) No results for input(s): BNP in the last 8760 hours.  ProBNP (last 3 results) No results for input(s): PROBNP in the last 8760 hours.  CBG:  Recent Labs Lab 02/09/15 1556  GLUCAP >600*    Radiological Exams on Admission: Dg Chest Portable 1 View  02/09/2015  CLINICAL DATA:  59 year old female with acute chest pain. EXAM: PORTABLE CHEST 1 VIEW  COMPARISON:  09/28/2003 radiographs FINDINGS: The cardiomediastinal silhouette is unremarkable. A left Port-A-Cath is present with tip overlying the lower SVC. There is no evidence of focal airspace disease, pulmonary edema, suspicious pulmonary nodule/mass, pleural effusion, or pneumothorax. No acute bony abnormalities are identified. IMPRESSION: No active disease. Electronically Signed   By: Margarette Canada M.D.   On: 02/09/2015 16:15    EKG: Independently reviewed. Sinus tachy  Assessment/Plan Principal Problem:   DKA (diabetic ketoacidoses) (HCC) Active Problems:   Acute renal failure (ARF) (HCC)   Dehydration   New onset type 2 diabetes mellitus ([place])  Elevated troponin   DKA, type 2 ([place])   1. Diabetic ketoacidosis. New onset diabetes. Patient will be admitted to the stepdown unit and started on aggressive IV hydration as well as insulin infusion per daycare protocol. Will check hemoglobin A1c. Currently anion gap is 29. Once anion gap is closed, will transition to basal insulin. will request diabetes coordinator input. 2.  New onset diabetes. No family history of diabetes. Anticipate that she will need to be discharged home on basal insulin. She will need follow-up with primary care physician. 3. Acute renal failure. Related to dehydration. Will continue aggressive IV hydration and repeat labs in the morning. Follow-up in culture. 4. Dehydration. Related to ketoacidosis. Continue IV fluids. 5. Elevated troponin. Suspect this is a demand ischemia in the setting of tachycardia due to #1 as well as decreased clearance in the setting of renal failure. We'll continue to cycle troponins. EKG is unremarkable.    Code Status: full code  DVT Prophylaxis: lovenox Family Communication: discussed with patient and daughter at the bedside. Patient does not want her diagnosis of diabetes communicated with her husband, since he himself already has many medical problems and she does not wish to stress  him further.  Disposition Plan: discharge home once improved  Time spent: 1mins  MEMON,JEHANZEB [facility] Pager [phone]

## 2015-02-09 NOTE — ED Notes (Signed)
Critical Glucose of 1384 called by Dorette Grate- LAB. Dr. Christy Gentles made aware.

## 2015-02-09 NOTE — ED Provider Notes (Signed)
CSN: [id number]     Arrival date & time 02/09/15  1520 History   First MD Initiated Contact with Patient 02/09/15 1534     Chief Complaint  Patient presents with  . Chest Pain    Patient is a 59 y.o. female presenting with weakness. The history is provided by the patient and a relative.  Weakness This is a new problem. The current episode started more than 2 days ago. The problem occurs daily. The problem has been gradually worsening. Associated symptoms include chest pain and shortness of breath. Pertinent negatives include no headaches. Associated symptoms comments: Chronic abdominal pain from firbroids . Nothing aggravates the symptoms. Nothing relieves the symptoms. She has tried nothing for the symptoms.  pt presents for multiple complaints:  1. 6 days ago pt started reporting generalized fatigue that is worsening.  No vomiting but reports decreased appetite.  2. She has also had brief/intermittent episodes of CP.  She is unable to describe in detail but reports it is worse with movement in bed.  No pleuritic CP.  She does feel SOB.  No significant cough is reported  She also has hoarse voice.  She reports she had onset of thrush recently felt it was due to antibiotic she took several months ago for mastitis.    No h/o CAD/CVA Also - pt with h/o colon CA that was treated several yrs ago and is in remission   Past Medical History  Diagnosis Date  . Anemia   . Peptic ulcer   . Colon cancer ([place])   . Gall bladder disease     inflammation  . Yeast infection involving the vagina and surrounding area   . Abdominal pain, other specified site   . Arthritis   . Fibroids   . Adenocarcinoma of cecum ([place]) 01/15/2014  . Hypertension    Past Surgical History  Procedure Laterality Date  . Colon surgery    . Egd/tcs    . Bilateral nipple resections    . Prot-a-cath placement    . Colonoscopy    . Colonoscopy N/A 11/29/2013    Procedure: COLONOSCOPY;  Surgeon: Rogene Houston, MD;   [facility];  Service: Endoscopy;  Laterality: N/A;  1030   Family History  Problem Relation Age of Onset  . Cancer Mother     lung cancer  . Heart attack Brother   . Kidney disease Sister   . Thyroid nodules Daughter   . Fibroids Sister    Social History  Substance Use Topics  . Smoking status: Never Smoker   . Smokeless tobacco: Never Used  . Alcohol Use: No   OB History    Gravida Para Term Preterm AB TAB SAB Ectopic Multiple Living   2 2 2       2      Review of Systems  Constitutional: Positive for appetite change and fatigue.  HENT: Positive for voice change.        Hoarse voice   Respiratory: Positive for shortness of breath.   Cardiovascular: Positive for chest pain.  Gastrointestinal: Negative for vomiting.       Chronic ABD pain   Neurological: Positive for weakness. Negative for headaches.  All other systems reviewed and are negative.     Allergies  Oxycodone-acetaminophen  Home Medications   Prior to Admission medications   Medication Sig Start Date End Date Taking? Authorizing Provider  amLODipine (NORVASC) 10 MG tablet Take 1 tablet (10 mg total) by mouth daily. 12/11/14  Jonnie Kind, MD  aspirin 81 MG tablet Take 81 mg by mouth daily.    Historical Provider, MD  Calcium Carbonate-Vitamin D (CALCIUM + D PO) Take by mouth daily.      Historical Provider, MD  cyclobenzaprine (FLEXERIL) 10 MG tablet Take 1 tablet (10 mg total) by mouth 3 (three) times daily as needed for muscle spasms. 06/19/14   Baird Cancer, PA-C  dicloxacillin (DYNAPEN) 250 MG capsule Take 1 capsule (250 mg total) by mouth 4 (four) times daily. 12/11/14   Jonnie Kind, MD  fish oil-omega-3 fatty acids 1000 MG capsule Take 1 g by mouth daily.      Historical Provider, MD  fluticasone (FLONASE) 50 MCG/ACT nasal spray Place 2 sprays into the nose as needed for allergies.     Historical Provider, MD  ibuprofen (ADVIL,MOTRIN) 200 MG tablet Take 200 mg by mouth daily  as needed for moderate pain.    Historical Provider, MD  lisinopril (PRINIVIL,ZESTRIL) 40 MG tablet Take 1 tablet (40 mg total) by mouth daily. 11/26/14   Jonnie Kind, MD  lisinopril-hydrochlorothiazide (PRINZIDE,ZESTORETIC) 20-12.5 MG tablet Take 2 tablets by mouth daily. 12/21/14   Jonnie Kind, MD  Multiple Vitamins-Minerals (CENTRUM SILVER PO) Take by mouth daily.      Historical Provider, MD  naproxen (NAPROSYN) 500 MG tablet Take 1 tablet (500 mg total) by mouth 2 (two) times daily with a meal. Patient not taking: Reported on 11/30/2014 01/15/14   Manon Hilding Kefalas, PA-C  OVER THE COUNTER MEDICATION Take 1 capsule by mouth daily. Tumeric    Historical Provider, MD  potassium chloride (KLOR-CON M10) 10 MEQ tablet Take 1 tablet (10 mEq total) by mouth 2 (two) times daily. 12/21/14   Patrici Ranks, MD  Simethicone (GAS RELIEF EXTRA STRENGTH PO) Take 1-2 tablets by mouth daily as needed (gas relief).    Historical Provider, MD  traMADol (ULTRAM) 50 MG tablet Take 1 tablet (50 mg total) by mouth every 6 (six) hours as needed. 10/31/14   Jonnie Kind, MD   BP 141/89 mmHg  Pulse 140  Temp(Src) 97.5 F (36.4 C) (Oral)  Resp 18  Ht 5\' 4"  (1.626 m)  Wt 72.576 kg  BMI 27.45 kg/m2  SpO2 96% Physical Exam CONSTITUTIONAL: ill appearing, she smells ketotic. She keeps eyes closed but will open eyes during conversation HEAD: Normocephalic/atraumatic EYES: EOMI/PERRL ENMT: Mucous membranes dry. No significant thrush noted NECK: supple no meningeal signs SPINE/BACK:entire spine nontender CV: S1/S2 noted, tachycardic LUNGS:tachypneic but lung sounds are clear ABDOMEN: soft, mild low abd tenderness that she reports is chronic, no rebound or guarding, bowel sounds noted throughout abdomen GU:no cva tenderness NEURO: Pt is sleepy but arousabLE and otherwise appropriate, moves all extremitiesx4. No arm/leg drift is noted EXTREMITIES: pulses normal/equal, full ROM SKIN: warm, color  normal PSYCH: no abnormalities of mood noted, alert and oriented to situation  ED Course  Procedures  CRITICAL CARE Performed by: Sharyon Cable Total critical care time: 40 minutes Critical care time was exclusive of separately billable procedures and treating other patients. Critical care was necessary to treat or prevent imminent or life-threatening deterioration. Critical care was time spent personally by me on the following activities: development of treatment plan with patient and/or surrogate as well as nursing, discussions with consultants, evaluation of patient's response to treatment, examination of patient, obtaining history from patient or surrogate, ordering and performing treatments and interventions, ordering and review of laboratory studies, ordering and review of radiographic  studies, pulse oximetry and re-evaluation of patient's condition. PATIENT WITH DKA, GLUCOSE >1000, REQUIRING IV FLUIDS AND IV INSULIN AND STEPDOWN ADMISSION AND FREQUENT MONITORING.  PT IS CRITICALLY ILL   4:19 PM Pt smells ketotic, tachycardic and hyperglycemic This is new onset DM with DKA Pt reports brief episodes of CP, but not pleuritic and currently CP free.  Will monitor 5:18 PM PT FOUND TO HAVE NEW ONSET DIABETES WITH DKA AFTER IV FLUIDS, SHE IS MORE AWAKE/ALERT/INTERACTIVE HER VITALS ARE IMPROVED (TACHYCARDIA IMPROVED) SHE ALSO HAS NEW ONSET RENAL FAILURE WILL NEED ADMISSION FOR CHEST PAIN - NONE CURRENTLY, AND APPEARS PAIN IS VERY BRIEF SUSPECT TROPONIN ELEVATION IS DUE TO METABOLIC DERANGEMENTS I HAVE CONSULTED DR [facility] TO ADMIT PATIENT  Labs Review Labs Reviewed  TROPONIN I - Abnormal; Notable for the following:    Troponin I 0.04 (*)    All other components within normal limits  CBC WITH DIFFERENTIAL/PLATELET - Abnormal; Notable for the following:    WBC 13.6 (*)    RBC 5.60 (*)    Hemoglobin 15.8 (*)    HCT 53.5 (*)    MCHC 29.5 (*)    Neutro Abs 11.8 (*)     Monocytes Absolute 1.1 (*)    All other components within normal limits  COMPREHENSIVE METABOLIC PANEL - Abnormal; Notable for the following:    Chloride 96 (*)    CO2 18 (*)    Glucose, Bld 1384 (*)    BUN 53 (*)    Creatinine, Ser 3.08 (*)    Calcium 10.5 (*)    Total Protein 9.3 (*)    GFR calc non Af Amer 15 (*)    GFR calc Af Amer 18 (*)    Anion gap 29 (*)    All other components within normal limits  URINALYSIS, ROUTINE W REFLEX MICROSCOPIC (NOT AT [facility]) - Abnormal; Notable for the following:    APPearance HAZY (*)    Glucose, UA >1000 (*)    Hgb urine dipstick LARGE (*)    Ketones, ur 15 (*)    Protein, ur 30 (*)    All other components within normal limits  URINE MICROSCOPIC-ADD ON - Abnormal; Notable for the following:    Bacteria, UA RARE (*)    All other components within normal limits  CBG MONITORING, ED - Abnormal; Notable for the following:    Glucose-Capillary >600 (*)    All other components within normal limits  CBG MONITORING, ED    Imaging Review Dg Chest Portable 1 View  02/09/2015  CLINICAL DATA:  59 year old female with acute chest pain. EXAM: PORTABLE CHEST 1 VIEW COMPARISON:  09/28/2003 radiographs FINDINGS: The cardiomediastinal silhouette is unremarkable. A left Port-A-Cath is present with tip overlying the lower SVC. There is no evidence of focal airspace disease, pulmonary edema, suspicious pulmonary nodule/mass, pleural effusion, or pneumothorax. No acute bony abnormalities are identified. IMPRESSION: No active disease. Electronically Signed   By: Margarette Canada M.D.   On: 02/09/2015 16:15   I have personally reviewed and evaluated these images and lab results as part of my medical decision-making.   EKG Interpretation   Date/Time:  Saturday February 09 2015 15:32:30 EST Ventricular Rate:  143 PR Interval:  131 QRS Duration: 85 QT Interval:  291 QTC Calculation: 449 R Axis:   60 Text Interpretation:  Sinus tachycardia Borderline T wave  abnormalities  Baseline wander in lead(s) II aVR aVF V6 artifact noted No previous ECGs  available Abnormal ekg Confirmed by  Christy Gentles  MD, Elenore Rota ([id number]) on  02/09/2015 3:36:06 PM     Medications  insulin regular (NOVOLIN R,HUMULIN R) 250 Units in sodium chloride 0.9 % 250 mL (1 Units/mL) infusion (not administered)  sodium chloride 0.9 % bolus 1,000 mL (1,000 mLs Intravenous New Bag/Given 02/09/15 1601)  sodium chloride 0.9 % bolus 1,000 mL (1,000 mLs Intravenous New Bag/Given 02/09/15 1633)    MDM   Final diagnoses:  Diabetic ketoacidosis without coma associated with other specified diabetes mellitus ([place])  AKI (acute kidney injury) [facility])    Nursing notes including past medical history and social history reviewed and considered in documentation xrays/imaging reviewed by myself and considered during evaluation Labs/vital reviewed myself and considered during evaluation     Ripley Fraise, MD 02/09/15 1721

## 2015-02-09 NOTE — ED Notes (Addendum)
Patient c/o non-radiating, mid-sternal chest pain that started Wednesday. Per patient weakness, dizziness, hoarse, and shortness of breath with exertion. Family reports patient losing weight recently due to thrush in mouth. Patient reports possible getting thrush from taking antibiotics in October for infection in left breast (docloxacillin.) Patient has also been c/o lower abd pain with vaginal bleeding/ "spotting." Per daughter patient has fibroids.

## 2015-02-09 NOTE — ED Notes (Signed)
MD at bedside. 

## 2015-02-09 NOTE — Progress Notes (Signed)
Pt has new onset DM. The patient does not want her husband to know her diagnosis. He thinks she has been admitted for dehydration.

## 2015-02-10 DIAGNOSIS — E870 Hyperosmolality and hypernatremia: Secondary | ICD-10-CM | POA: Diagnosis not present

## 2015-02-10 LAB — GLUCOSE, CAPILLARY
GLUCOSE-CAPILLARY: 143 mg/dL — AB (ref 65–99)
GLUCOSE-CAPILLARY: 230 mg/dL — AB (ref 65–99)
GLUCOSE-CAPILLARY: 244 mg/dL — AB (ref 65–99)
GLUCOSE-CAPILLARY: 295 mg/dL — AB (ref 65–99)
GLUCOSE-CAPILLARY: 355 mg/dL — AB (ref 65–99)
GLUCOSE-CAPILLARY: 442 mg/dL — AB (ref 65–99)
GLUCOSE-CAPILLARY: 452 mg/dL — AB (ref 65–99)
GLUCOSE-CAPILLARY: 90 mg/dL (ref 65–99)
GLUCOSE-CAPILLARY: 98 mg/dL (ref 65–99)
Glucose-Capillary: 125 mg/dL — ABNORMAL HIGH (ref 65–99)
Glucose-Capillary: 166 mg/dL — ABNORMAL HIGH (ref 65–99)
Glucose-Capillary: 170 mg/dL — ABNORMAL HIGH (ref 65–99)
Glucose-Capillary: 193 mg/dL — ABNORMAL HIGH (ref 65–99)
Glucose-Capillary: 200 mg/dL — ABNORMAL HIGH (ref 65–99)
Glucose-Capillary: 265 mg/dL — ABNORMAL HIGH (ref 65–99)
Glucose-Capillary: 308 mg/dL — ABNORMAL HIGH (ref 65–99)
Glucose-Capillary: 372 mg/dL — ABNORMAL HIGH (ref 65–99)

## 2015-02-10 LAB — CBC
HCT: 39.2 % (ref 36.0–46.0)
HEMOGLOBIN: 12.7 g/dL (ref 12.0–15.0)
MCH: 27.4 pg (ref 26.0–34.0)
MCHC: 32.4 g/dL (ref 30.0–36.0)
MCV: 84.5 fL (ref 78.0–100.0)
Platelets: 161 10*3/uL (ref 150–400)
RBC: 4.64 MIL/uL (ref 3.87–5.11)
RDW: 14.8 % (ref 11.5–15.5)
WBC: 14.8 10*3/uL — ABNORMAL HIGH (ref 4.0–10.5)

## 2015-02-10 LAB — BASIC METABOLIC PANEL
Anion gap: 10 (ref 5–15)
Anion gap: 5 (ref 5–15)
BUN: 37 mg/dL — AB (ref 6–20)
BUN: 36 mg/dL — AB (ref 6–20)
BUN: 37 mg/dL — AB (ref 6–20)
CHLORIDE: 130 mmol/L — AB (ref 101–111)
CHLORIDE: 125 mmol/L — AB (ref 101–111)
CO2: 25 mmol/L (ref 22–32)
CO2: 25 mmol/L (ref 22–32)
CO2: 25 mmol/L (ref 22–32)
CREATININE: 1.28 mg/dL — AB (ref 0.44–1.00)
Calcium: 8.9 mg/dL (ref 8.9–10.3)
Calcium: 8.6 mg/dL — ABNORMAL LOW (ref 8.9–10.3)
Calcium: 8.1 mg/dL — ABNORMAL LOW (ref 8.9–10.3)
Creatinine, Ser: 1.31 mg/dL — ABNORMAL HIGH (ref 0.44–1.00)
Creatinine, Ser: 1.34 mg/dL — ABNORMAL HIGH (ref 0.44–1.00)
GFR calc Af Amer: 50 mL/min — ABNORMAL LOW (ref >60)
GFR calc Af Amer: 51 mL/min — ABNORMAL LOW (ref >60)
GFR calc Af Amer: 48 mL/min — ABNORMAL LOW (ref >60)
GFR calc non Af Amer: 43 mL/min — ABNORMAL LOW (ref >60)
GFR calc non Af Amer: 44 mL/min — ABNORMAL LOW (ref >60)
GFR calc non Af Amer: 42 mL/min — ABNORMAL LOW (ref >60)
GLUCOSE: 194 mg/dL — AB (ref 65–99)
GLUCOSE: 574 mg/dL — AB (ref 65–99)
Glucose, Bld: 314 mg/dL — ABNORMAL HIGH (ref 65–99)
POTASSIUM: 3.3 mmol/L — AB (ref 3.5–5.1)
POTASSIUM: 4.4 mmol/L (ref 3.5–5.1)
POTASSIUM: 4.4 mmol/L (ref 3.5–5.1)
SODIUM: 164 mmol/L — AB (ref 135–145)
Sodium: 165 mmol/L (ref 135–145)
Sodium: 155 mmol/L — ABNORMAL HIGH (ref 135–145)

## 2015-02-10 LAB — TROPONIN I
Troponin I: 0.13 ng/mL — ABNORMAL HIGH (ref <0.031)
Troponin I: 0.13 ng/mL — ABNORMAL HIGH (ref <0.031)

## 2015-02-10 MED ORDER — INSULIN ASPART 100 UNIT/ML ~~LOC~~ SOLN: 0.0000 [IU] | Freq: Every day | SUBCUTANEOUS | Status: DC

## 2015-02-10 MED ORDER — INSULIN ASPART 100 UNIT/ML ~~LOC~~ SOLN: 0.0000 [IU] | Freq: Three times a day (TID) | SUBCUTANEOUS | Status: DC

## 2015-02-10 MED ORDER — MAGIC MOUTHWASH
10.0000 mL | Freq: Three times a day (TID) | ORAL | Status: DC
  Administered 2015-02-10 – 2015-02-11 (×3): 10 mL via ORAL
  Filled 2015-02-10 (×3): qty 10

## 2015-02-10 MED ORDER — INSULIN GLARGINE 100 UNIT/ML ~~LOC~~ SOLN
25.0000 [IU] | Freq: Every day | SUBCUTANEOUS | Status: DC
  Filled 2015-02-10 (×2): qty 0.25

## 2015-02-10 MED ORDER — SODIUM CHLORIDE 0.45 % IV SOLN
INTRAVENOUS | Status: DC
  Administered 2015-02-10: 23:00:00 via INTRAVENOUS

## 2015-02-10 MED ORDER — INSULIN ASPART 100 UNIT/ML ~~LOC~~ SOLN
0.0000 [IU] | Freq: Three times a day (TID) | SUBCUTANEOUS | Status: DC
  Administered 2015-02-10: 9 [IU] via SUBCUTANEOUS

## 2015-02-10 MED ORDER — DEXTROSE-NACL 5-0.45 % IV SOLN
INTRAVENOUS | Status: DC
Start: 2015-02-10 — End: 2015-02-10
  Administered 2015-02-10 (×2): via INTRAVENOUS

## 2015-02-10 MED ORDER — DEXTROSE-NACL 5-0.45 % IV SOLN: INTRAVENOUS | Status: DC

## 2015-02-10 MED ORDER — SODIUM CHLORIDE 0.9 % IV SOLN
INTRAVENOUS | Status: DC
  Administered 2015-02-10: 5.1 [IU]/h via INTRAVENOUS
  Administered 2015-02-10: 12 [IU]/h via INTRAVENOUS
  Administered 2015-02-10: 7.3 [IU]/h via INTRAVENOUS
  Filled 2015-02-10: qty 2.5

## 2015-02-10 MED ORDER — ENOXAPARIN SODIUM 40 MG/0.4ML ~~LOC~~ SOLN
40.0000 mg | SUBCUTANEOUS | Status: DC
  Administered 2015-02-10 – 2015-02-11 (×2): 40 mg via SUBCUTANEOUS
  Filled 2015-02-10 (×2): qty 0.4

## 2015-02-10 MED ORDER — LIDOCAINE VISCOUS 2 % MT SOLN
5.0000 mL | Freq: Three times a day (TID) | OROMUCOSAL | Status: DC
  Administered 2015-02-10 (×2): 5 mL via OROMUCOSAL
  Filled 2015-02-10 (×2): qty 15

## 2015-02-10 MED ORDER — LIVING WELL WITH DIABETES BOOK
Freq: Once | Status: AC
  Administered 2015-02-10: 1
  Filled 2015-02-10: qty 1

## 2015-02-10 MED ORDER — MAGIC MOUTHWASH W/LIDOCAINE: 15.0000 mL | Freq: Three times a day (TID) | ORAL | Status: DC

## 2015-02-10 MED ORDER — DEXTROSE 5 % IV SOLN
INTRAVENOUS | Status: DC
  Administered 2015-02-10: 08:00:00 via INTRAVENOUS
  Filled 2015-02-10 (×7): qty 1000

## 2015-02-10 MED ORDER — INSULIN REGULAR NICU BOLUS VIA INFUSION
10.0000 [IU] | Freq: Once | INTRAVENOUS | Status: AC
  Administered 2015-02-10: 10 [IU] via INTRAVENOUS

## 2015-02-10 MED ORDER — INSULIN GLARGINE 100 UNIT/ML ~~LOC~~ SOLN
10.0000 [IU] | Freq: Every day | SUBCUTANEOUS | Status: DC
  Filled 2015-02-10: qty 0.1

## 2015-02-10 MED ORDER — SODIUM CHLORIDE 0.9 % IV SOLN
INTRAVENOUS | Status: DC
Start: 2015-02-10 — End: 2015-02-11

## 2015-02-10 NOTE — Progress Notes (Signed)
Pt continues to not allow her husband to have any knowledge of her dx of new onset Diabetes.all staff member are not mention it when he is present. Pt states that he is a non-insulin dependant diabetic. Pt was given the hand out from [place]. Also nursing staffprinted out several Diabetic handouts and gave them to pt in a plastic folder when her husband was not present. Both of her grown daughters are allowed knowledge of dx and are very eager to help pt face life style changes.

## 2015-02-10 NOTE — Plan of Care (Signed)
Problem: Education: Goal: Knowledge of the prescribed therapeutic regimen will improve Outcome: Not Progressing PT REFUSES TO ALLOW ANY TEACHING D/T TO FAMILY IN ROOM FREQUENTLY Goal: Ability to manage health-related needs will improve Outcome: Not Progressing PT IS IN DENIAL OF NEED FOR Vanessa [name]. SHE WANTS TO TRY DIET AND HERB TX    Goal: Ability to identify and alter actions that are detrimental to health will improve Outcome: Progressing PT DOES UNDERSTAND THE IMPORTANCE OF FOOD CHOICES

## 2015-02-10 NOTE — Progress Notes (Signed)
[facility] PROGRESS NOTE  Vanessa [name] [id number] DOB: 1955-03-22 DOA: 02/09/2015 PCP: Default, Provider, MD  Assessment/Plan: 1. Diabetic ketoacidosis. New onset diabetes. Started on IVF and IV Insulin. Anion gap is now 10, transitioned to basal insulin. Hgb A1C pending. Blood sugars improved although still elevated, will continue to monitor. 2. New onset diabetes. No family history of diabetes. Anticipate that she will need to be discharged home on basal insulin. She will need follow-up with primary care physician. 3. Acute renal failure. Related to dehydration. Improving with IV hydration.  4. Dehydration. Related to ketoacidosis. Continue IV fluids. 5. Hypernatremia, likely related to aggressive IV hydration with saline. Will start on D5W and monitor. 6. Elevated troponin. Suspect this is a demand ischemia in the setting of tachycardia due to #1 as well as decreased clearance in the setting of renal failure. We'll continue to cycle troponins. EKG is unremarkable. She is not having any CP.    Code Status: full code  DVT Prophylaxis: lovenox Family Communication: Discussed with patient who understands and has no concerns at this time. Disposition Plan: Monitor in ICU.    Consultants:    Procedures:    Antibiotics:    HPI/Subjective: Feels tired but weakness is improved since she ate. Denies any SOB or CP.    Objective: Filed Vitals:   02/10/15 0700 02/10/15 0720  BP: 140/83   Pulse: 98   Temp:  97.2 F (36.2 C)  Resp: 18     Intake/Output Summary (Last 24 hours) at 02/10/15 0737 Last data filed at 02/10/15 0600  Gross per 24 hour  Intake 1558.9 ml  Output    200 ml  Net 1358.9 ml   Filed Weights   02/09/15 1538 02/09/15 1730 02/10/15 0400  Weight: 72.576 kg (160 lb) 65.5 kg (144 lb 6.4 oz) 65.7 kg (144 lb 13.5 oz)    Exam:  General: NAD, looks comfortable Cardiovascular: mildly tachycardic Respiratory: clear bilaterally, No wheezing, rales  or rhonchi Abdomen: soft, non tender, no distention , bowel sounds normal Musculoskeletal: No edema b/l   Data Reviewed: Basic Metabolic Panel:  Recent Labs Lab 02/09/15 1549 02/09/15 1838 02/10/15 0137 02/10/15 0458  NA 143 154* 165* 164*  K 4.2 3.8 3.3* 4.4  CL 96* 115* 130* >130*  CO2 18* 18* 25 25  GLUCOSE 1384* 963* 194* 314*  BUN 53* 50* 37* 36*  CREATININE 3.08* 2.47* 1.31* 1.28*  CALCIUM 10.5* 9.1 8.9 8.6*   Liver Function Tests:  Recent Labs Lab 02/09/15 1549  AST 28  ALT 31  ALKPHOS 118  BILITOT 0.9  PROT 9.3*  ALBUMIN 4.9   CBC:  Recent Labs Lab 02/09/15 1549 02/09/15 1838 02/10/15 0458  WBC 13.6* 13.0* 14.8*  NEUTROABS 11.8*  --   --   HGB 15.8* 14.0 12.7  HCT 53.5* 43.8 39.2  MCV 95.5 88.1 84.5  PLT 295 228 161   Cardiac Enzymes:  Recent Labs Lab 02/09/15 1549 02/09/15 1838 02/10/15 0137 02/10/15 0458  TROPONINI 0.04* 0.05* 0.13* 0.13*   CBG:  Recent Labs Lab 02/09/15 2336 02/10/15 0036 02/10/15 0135 02/10/15 0231 02/10/15 0339  GLUCAP 297* 308* 166* 125* 170*    Recent Results (from the past 240 hour(s))  MRSA PCR Screening     Status: None   Collection Time: 02/09/15  6:05 PM  Result Value Ref Range Status   MRSA by PCR NEGATIVE NEGATIVE Final    Comment:        The GeneXpert MRSA Assay (FDA  approved for NASAL specimens only), is one component of a comprehensive MRSA colonization surveillance program. It is not intended to diagnose MRSA infection nor to guide or monitor treatment for MRSA infections.      Studies: Dg Chest Portable 1 View  02/09/2015  CLINICAL DATA:  59 year old female with acute chest pain. EXAM: PORTABLE CHEST 1 VIEW COMPARISON:  09/28/2003 radiographs FINDINGS: The cardiomediastinal silhouette is unremarkable. A left Port-A-Cath is present with tip overlying the lower SVC. There is no evidence of focal airspace disease, pulmonary edema, suspicious pulmonary nodule/mass, pleural effusion, or  pneumothorax. No acute bony abnormalities are identified. IMPRESSION: No active disease. Electronically Signed   By: Margarette Canada M.D.   On: 02/09/2015 16:15    Scheduled Meds: . aspirin  81 mg Oral Daily  . enoxaparin (LOVENOX) injection  30 mg Subcutaneous Q24H  . insulin aspart  0-5 Units Subcutaneous QHS  . insulin aspart  0-9 Units Subcutaneous TID WC  . insulin glargine  10 Units Subcutaneous QHS   Continuous Infusions: . sodium chloride Stopped (02/10/15 0200)  . dextrose 5 % and 0.45% NaCl 75 mL/hr at 02/10/15 0600  . dextrose 5 % and 0.45% NaCl    . insulin (NOVOLIN-R) infusion Stopped (02/10/15 0330)    Principal Problem:   DKA (diabetic ketoacidoses) (HCC) Active Problems:   Acute renal failure (ARF) (HCC)   Dehydration   New onset type 2 diabetes mellitus (HCC)   Elevated troponin   DKA, type 2 ([employer])    Time spent: 20  minutes   Jenalyn Girdner. MD  [facility] Pager [phone]. If 7PM-7AM, please contact night-coverage at [email or web address], password [facility] 02/10/2015, 7:37 AM  LOS: 1 day     By signing my name below, I, Rosalie Doctor, attest that this documentation has been prepared under the direction and in the presence of [facility]. MD Electronically Signed: Rosalie Doctor, Scribe. 02/10/2015 8:52am  I, Dr. Kathie Dike, personally performed the services described in this documentaiton. All medical record entries made by the scribe were at my direction and in my presence. I have reviewed the chart and agree that the record reflects my personal performance and is accurate and complete  Kathie Dike, MD, 02/10/2015 9:14 AM

## 2015-02-10 NOTE — Progress Notes (Signed)
Notified physician of lab results. Orders received and followed.

## 2015-02-11 LAB — GLUCOSE, CAPILLARY
GLUCOSE-CAPILLARY: 253 mg/dL — AB (ref 65–99)
GLUCOSE-CAPILLARY: 381 mg/dL — AB (ref 65–99)
GLUCOSE-CAPILLARY: 258 mg/dL — AB (ref 65–99)
GLUCOSE-CAPILLARY: 274 mg/dL — AB (ref 65–99)
Glucose-Capillary: 217 mg/dL — ABNORMAL HIGH (ref 65–99)

## 2015-02-11 LAB — CBC
HEMATOCRIT: 35.5 % — AB (ref 36.0–46.0)
HEMOGLOBIN: 11.1 g/dL — AB (ref 12.0–15.0)
MCH: 26.9 pg (ref 26.0–34.0)
MCHC: 31.3 g/dL (ref 30.0–36.0)
MCV: 86 fL (ref 78.0–100.0)
Platelets: 126 10*3/uL — ABNORMAL LOW (ref 150–400)
RBC: 4.13 MIL/uL (ref 3.87–5.11)
RDW: 15.3 % (ref 11.5–15.5)
WBC: 11.5 10*3/uL — ABNORMAL HIGH (ref 4.0–10.5)

## 2015-02-11 LAB — BASIC METABOLIC PANEL
Anion gap: 5 (ref 5–15)
BUN: 26 mg/dL — AB (ref 6–20)
CHLORIDE: 120 mmol/L — AB (ref 101–111)
CO2: 25 mmol/L (ref 22–32)
Calcium: 8 mg/dL — ABNORMAL LOW (ref 8.9–10.3)
Creatinine, Ser: 0.99 mg/dL (ref 0.44–1.00)
GFR calc Af Amer: >60 mL/min (ref >60)
GFR calc non Af Amer: >60 mL/min (ref >60)
Glucose, Bld: 394 mg/dL — ABNORMAL HIGH (ref 65–99)
POTASSIUM: 4.3 mmol/L (ref 3.5–5.1)
SODIUM: 150 mmol/L — AB (ref 135–145)

## 2015-02-11 LAB — HEMOGLOBIN A1C
Hgb A1c MFr Bld: 12.9 % — ABNORMAL HIGH (ref 4.8–5.6)
Mean Plasma Glucose: 324 mg/dL

## 2015-02-11 MED ORDER — GLIPIZIDE 5 MG PO TABS
5.0000 mg | ORAL_TABLET | Freq: Two times a day (BID) | ORAL | Status: DC
Start: 2015-02-11 — End: 2015-02-12
  Administered 2015-02-11 – 2015-02-12 (×3): 5 mg via ORAL
  Filled 2015-02-11 (×3): qty 1

## 2015-02-11 MED ORDER — NYSTATIN 100000 UNIT/ML MT SUSP
5.0000 mL | Freq: Four times a day (QID) | OROMUCOSAL | Status: DC
  Administered 2015-02-11 – 2015-02-12 (×6): 500000 [IU] via ORAL
  Filled 2015-02-11 (×6): qty 5

## 2015-02-11 MED ORDER — FLUCONAZOLE IN SODIUM CHLORIDE 100-0.9 MG/50ML-% IV SOLN
100.0000 mg | INTRAVENOUS | Status: DC
  Administered 2015-02-11 – 2015-02-12 (×2): 100 mg via INTRAVENOUS
  Filled 2015-02-11 (×4): qty 50

## 2015-02-11 MED ORDER — INSULIN STARTER KIT- PEN NEEDLES (ENGLISH)
1.0000 | Freq: Once | Status: AC
  Administered 2015-02-11: 1
  Filled 2015-02-11: qty 1

## 2015-02-11 MED ORDER — SALINE SPRAY 0.65 % NA SOLN
1.0000 | NASAL | Status: DC | PRN
  Administered 2015-02-11 (×2): 1 via NASAL
  Filled 2015-02-11 (×2): qty 44

## 2015-02-11 MED ORDER — SODIUM CHLORIDE 0.45 % IV SOLN
INTRAVENOUS | Status: DC
  Administered 2015-02-11 – 2015-02-12 (×2): via INTRAVENOUS

## 2015-02-11 MED ORDER — METFORMIN HCL 500 MG PO TABS
1000.0000 mg | ORAL_TABLET | Freq: Two times a day (BID) | ORAL | Status: DC
  Administered 2015-02-11 – 2015-02-12 (×3): 1000 mg via ORAL
  Filled 2015-02-11 (×3): qty 2

## 2015-02-11 MED ORDER — INSULIN ASPART 100 UNIT/ML ~~LOC~~ SOLN
0.0000 [IU] | SUBCUTANEOUS | Status: DC
  Administered 2015-02-11: 5 [IU] via SUBCUTANEOUS
  Administered 2015-02-11: 3 [IU] via SUBCUTANEOUS
  Administered 2015-02-11: 5 [IU] via SUBCUTANEOUS
  Administered 2015-02-11: 9 [IU] via SUBCUTANEOUS
  Administered 2015-02-11: 5 [IU] via SUBCUTANEOUS
  Administered 2015-02-12 (×2): 3 [IU] via SUBCUTANEOUS
  Administered 2015-02-12: 5 [IU] via SUBCUTANEOUS
  Administered 2015-02-12 (×2): 3 [IU] via SUBCUTANEOUS

## 2015-02-11 NOTE — Progress Notes (Signed)
Discussed with patient watching Diabetes education videos. Patient states she is not interested in watching the videos states she has watch videos before when her husband was diagnosis with Diabetes. Informed patient it would be beneficial for her to watch the videos for education regarding Diabetes. Provided patient with education booklet with insulin syringes Patient states she is not interested and she will not be taking insulin at home. Patient not interested in Diabetes education at this time. Husband at bedside. Patient states magic mouthwash not effective. Informed Dr. Roderic Palau patient request to change magic mouthwash and not wanting to take insulin.

## 2015-02-11 NOTE — Plan of Care (Signed)
Problem: Education: Goal: Knowledge of the prescribed therapeutic regimen will improve Outcome: Not Progressing Patient states she is not going to take insulin injections at home.

## 2015-02-11 NOTE — Progress Notes (Signed)
Went to speak with patient around 10:30 am and patient asked that diabetes coordinator come back later. Went back to talk with patient and had RN ask patient's husband to step out of the room. Spoke with patient about new diabetes diagnosis. Patient reports that she "probably doesn't have diabetes. My sugar was so high when I came in because all I drank on Saturday was drinks with sugar. I drank a whole container of orange juice, several large glasses of juicy juice, and sweet tea." Explained to the patient that her initial glucose was 1384 mg/dl on 02/09/15 and that her A1C was 12.9%. Explained DM diagnosis criteria and explained to the patient that her A1C of 12.9% indicates an average glucose of 324 mg/dl over the past 2-3 months. Explained what an A1C is in more detail. Patient reports that her husband has diabetes and that he takes pills for diabetes. When asked several questions to assess DM knowledge, patient was unable to answer questions correctly. Inquired about the Living Well with Diabetes booklet that she was given and patient reports that her daughter took the book home to read over it. Discussed basic pathophysiology of DM Type 2, basic home care, importance of checking CBGs and maintaining good CBG control to prevent long-term and short-term complications. Reviewed glucose and A1C goals and explained that patient will need to take medication as prescribed and she will need to monitor glucose 2-4 times per day. Patient states that her husband has a glucometer at home but he does not use it.  Reviewed signs and symptoms of hyperglycemia and hypoglycemia along with treatment for both. Discussed impact of nutrition, exercise, stress, sickness, and medications on diabetes control.  Patient reports that she would like to get her own glucometer. Stressed importance of checking glucose at least twice a day and ideally 4 times a day (before meals and at bedtime) to determine response of medications on  glycemic control.  Asked patient to check her glucose as the doctor prescribes and to keep a log book of glucose readings and DM medications taken. Explained how the doctor she follows up with can use the log book to continue to make insulin adjustments if needed. Patient does not have a PCP and states that she would like to try to establish care with Dr. Moshe Cipro. Patient stated that "he told me I can not take any insulin." When asking for clarification of her statement, patient stated "My husband said I can not take any insulin. He takes pills for his sugar and some people don't have to even take pills they can keep their sugar controlled with just their diet." Explained to the patient that the attending doctor is going to try her on Metformin and Glipizide to determine if they will be sufficient for glycemic control. Also explained that everyone is different and sometimes the pills and diet modifications are not enough to keep diabetes controlled and if that was the case for her she may need to take insulin. Patient stated "My husband is the breadwinner in my family. I do not bring in any income. So he wants me to be on pills for my sugar and if that doesn't work then he may pay for insulin." Explained how Metformin and Glipizide work to help maintain glycemic control. Informed patient that both the Metformin and Glipizide can be purchased at [facility] for $4 each for a 30 day supply. Explained that if the pills do not work for her, she may get the same symptoms she presented  with and be back in the hospital. Attempted to discuss insulin but patient states that she does not want to talk about insulin because she is not going to take any insulin at home but she is willing to take pills for diabetes. Patient has the insulin starter kit at bedside. Encouraged patient to look through the insulin pen starter kit and to watch patient education videos on diabetes to enhance her knowledge about diabetes.  Patient  verbalized understanding of information discussed and she states that she has no further questions at this time related to diabetes.  RNs to provide ongoing basic DM education at bedside with this patient and engage patient to actively check blood glucose and administer insulin injections.   Talked with Tammy, RN Case Manager about contacting Dr. Griffin Dakin office to establish care. Tammy, RN Case Manager plans to contact Dr. Griffin Dakin office to see if they are excepting new patients.  Thanks, Barnie Alderman, RN, MSN, CDE Diabetes Coordinator Inpatient Diabetes Program [phone] (Team Pager from [place] to [place]) [phone] ([place] office) [phone] [facility] office) [phone] [facility] office)

## 2015-02-11 NOTE — Progress Notes (Signed)
Report called Magda Paganini RN patient transported via wheelchair to room 314. Family present.

## 2015-02-11 NOTE — Care Management Note (Signed)
Case Management Note  Patient Details  Name: AMORITA JOHANSEN MRN: [id number] Date of Birth: 07/17/1955  Subjective/Objective:                  Pt admitted from home with DKA. Pt lives with her husband and will return home at discharge. Pt is independent with ADL's.  Action/Plan: Pt will need follow up arranged with Dr. Dorris Fetch prior to discharge for follow up. Pt has requested PCP care with Dr. Moshe Cipro. CM called office and Dr. Moshe Cipro is not taking any new pts at this time. Discussed [facility] with pt who would like to talk to her children about it first. Will continue to follow.  Expected Discharge Date:                  Expected Discharge Plan:  Home/Self Care  In-House Referral:  NA  Discharge planning Services  CM Consult, Follow-up appt scheduled  Post Acute Care Choice:  NA Choice offered to:  NA  DME Arranged:    DME Agency:     HH Arranged:    HH Agency:     Status of Service:  Completed, signed off  Medicare Important Message Given:    Date Medicare IM Given:    Medicare IM give by:    Date Additional Medicare IM Given:    Additional Medicare Important Message give by:     If discussed at [place] of Stay Meetings, dates discussed:    Additional Comments:  Joylene Draft, RN 02/11/2015, 3:15 PM

## 2015-02-11 NOTE — Progress Notes (Signed)
Dr Darrick Meigs called about blood sugar of 253 with no coverage ordered; explained patient current situation to Dr Darrick Meigs and that patient has been off of insulin drip, IVF had been changed, patient had refused bedtime insulin; orders received to change [place]

## 2015-02-11 NOTE — Progress Notes (Signed)
[facility] PROGRESS NOTE  Vanessa [name] [id number] DOB: Jul 23, 1955 DOA: 02/09/2015 PCP: Default, Provider, MD  Assessment/Plan: 1. Diabetic ketoacidosis. New onset diabetes. Started on IVF and IV Insulin. Anion gap is closed. Patient is refusing insulin at this time. She wishes to have a trial of oral medications. Will start on metformin and glipizide. Hgb A1C 12.9.  2. New onset diabetes. No family history of diabetes. She will need follow-up with primary care physician. Discuss with daughter the importance of diabetic compliance and education.  3. Acute renal failure, stable. Related to dehydration. Improving with IV hydration.  4. Dehydration, resolved. Related to ketoacidosis. Continue IV fluids. 5. Hypernatremia, improving. Likely related to aggressive IV hydration with saline. Continue on half normal saline and monitor. 6. Elevated troponin. Suspect this is a demand ischemia in the setting of tachycardia due to #1 as well as decreased clearance in the setting of renal failure. Denies CP. Will continue to cycle troponins. EKG is unremarkable.  7. Oral thrush. She is on nystatin and fluconazole.   Code Status: full code  DVT Prophylaxis: lovenox Family Communication:Family member bedside.  Discussed with patient who understands and has no concerns at this time. Disposition Plan: Anticipate discharge within 1-2 days.    Consultants:    Procedures:    Antibiotics:    HPI/Subjective:  Complains of gagging feeling while lying down. Complains of throat irritation and mild nasal congestion. Was able to ambulate without difficulty. Complaints of abdominal pain after Lovenox.   Objective: Filed Vitals:   02/11/15 0500 02/11/15 0600  BP: 141/72 138/73  Pulse: 89 90  Temp:    Resp: 20 15    Intake/Output Summary (Last 24 hours) at 02/11/15 [id number] Last data filed at 02/11/15 0600  Gross per 24 hour  Intake 2908.15 ml  Output   1750 ml  Net 1158.15 ml   Filed  Weights   02/09/15 1730 02/10/15 0400 02/11/15 0500  Weight: 65.5 kg (144 lb 6.4 oz) 65.7 kg (144 lb 13.5 oz) 68.9 kg (151 lb 14.4 oz)    Exam: General: NAD, looks comfortable ENT: Appears normal  Cardiovascular: RRR, S1, S2  Respiratory: clear bilaterally, No wheezing, rales or rhonchi Abdomen: soft, non tender, no distention , bowel sounds normal Musculoskeletal: No edema b/l   Data Reviewed: Basic Metabolic Panel:  Recent Labs Lab 02/09/15 1549 02/09/15 1838 02/10/15 0137 02/10/15 0458 02/10/15 1035  NA 143 154* 165* 164* 155*  K 4.2 3.8 3.3* 4.4 4.4  CL 96* 115* 130* >130* 125*  CO2 18* 18* 25 25 25   GLUCOSE 1384* 963* 194* 314* 574*  BUN 53* 50* 37* 36* 37*  CREATININE 3.08* 2.47* 1.31* 1.28* 1.34*  CALCIUM 10.5* 9.1 8.9 8.6* 8.1*   Liver Function Tests:  Recent Labs Lab 02/09/15 1549  AST 28  ALT 31  ALKPHOS 118  BILITOT 0.9  PROT 9.3*  ALBUMIN 4.9   CBC:  Recent Labs Lab 02/09/15 1549 02/09/15 1838 02/10/15 0458  WBC 13.6* 13.0* 14.8*  NEUTROABS 11.8*  --   --   HGB 15.8* 14.0 12.7  HCT 53.5* 43.8 39.2  MCV 95.5 88.1 84.5  PLT 295 228 161   Cardiac Enzymes:  Recent Labs Lab 02/09/15 1549 02/09/15 1838 02/10/15 0137 02/10/15 0458  TROPONINI 0.04* 0.05* 0.13* 0.13*   CBG:  Recent Labs Lab 02/10/15 2024 02/10/15 2125 02/10/15 2227 02/10/15 2341 02/11/15 0416  GLUCAP 193* 143* 98 90 253*    Recent Results (from the past 240 hour(s))  MRSA PCR Screening     Status: None   Collection Time: 02/09/15  6:05 PM  Result Value Ref Range Status   MRSA by PCR NEGATIVE NEGATIVE Final    Comment:        The GeneXpert MRSA Assay (FDA approved for NASAL specimens only), is one component of a comprehensive MRSA colonization surveillance program. It is not intended to diagnose MRSA infection nor to guide or monitor treatment for MRSA infections.      Studies: Dg Chest Portable 1 View  02/09/2015  CLINICAL DATA:  59 year old  female with acute chest pain. EXAM: PORTABLE CHEST 1 VIEW COMPARISON:  09/28/2003 radiographs FINDINGS: The cardiomediastinal silhouette is unremarkable. A left Port-A-Cath is present with tip overlying the lower SVC. There is no evidence of focal airspace disease, pulmonary edema, suspicious pulmonary nodule/mass, pleural effusion, or pneumothorax. No acute bony abnormalities are identified. IMPRESSION: No active disease. Electronically Signed   By: Margarette Canada M.D.   On: 02/09/2015 16:15    Scheduled Meds: . aspirin  81 mg Oral Daily  . enoxaparin (LOVENOX) injection  40 mg Subcutaneous Q24H  . insulin aspart  0-9 Units Subcutaneous 6 times per day  . insulin glargine  25 Units Subcutaneous QHS  . magic mouthwash  10 mL Oral TID   And  . lidocaine  5 mL Mouth/Throat TID   Continuous Infusions: . sodium chloride 75 mL/hr at 02/11/15 0600  . sodium chloride    . dextrose 5 % 1,000 mL infusion 125 mL/hr at 02/10/15 0815    Principal Problem:   DKA (diabetic ketoacidoses) ([place]) Active Problems:   Acute renal failure (ARF) ([place])   Dehydration   New onset type 2 diabetes mellitus (HCC)   Elevated troponin   DKA, type 2 (HCC)   Hypernatremia    Time spent: 20  minutes   Adaly Puder. MD  [facility] Pager [phone]. If 7PM-7AM, please contact night-coverage at [email or web address], password [facility] 02/11/2015, 7:02 AM  LOS: 2 days     By signing my name below, I, Rennis Harding, attest that this documentation has been prepared under the direction and in the presence of Kathie Dike, MD. Electronically signed: Rennis Harding, Scribe. 02/11/2015 9:58 AM  I, Dr. Kathie Dike, personally performed the services described in this documentaiton. All medical record entries made by the scribe were at my direction and in my presence. I have reviewed the chart and agree that the record reflects my personal performance and is accurate and complete  Kathie Dike, MD, 02/11/2015  10:29 AM

## 2015-02-11 NOTE — Progress Notes (Signed)
Inpatient Diabetes Program Recommendations  AACE/ADA: New Consensus Statement on Inpatient Glycemic Control (2015)  Target Ranges:  Prepandial:   less than 140 mg/dL      Peak postprandial:   less than 180 mg/dL (1-2 hours)      Critically ill patients:  140 - 180 mg/dL  Results for Vanessa [name], Vanessa [name] (MRN [id number]) as of 02/11/2015 07:38  Ref. Range 02/10/2015 14:33 02/10/2015 15:38 02/10/2015 16:32 02/10/2015 17:26 02/10/2015 18:28 02/10/2015 19:28 02/10/2015 20:24 02/10/2015 21:25 02/10/2015 22:27 02/10/2015 23:41 02/11/2015 04:16  Glucose-Capillary Latest Ref Range: 65-99 mg/dL 355 (H) 295 (H) 265 (H) 244 (H) 230 (H) 200 (H) 193 (H) 143 (H) 98 90 253 (H)   Review of Glycemic Control  Diabetes history: No Outpatient Diabetes medications: NA Current orders for Inpatient glycemic control: Lantus 25 units QHS, Novolog 0-9 unitsQ4H  Inpatient Diabetes Program Recommendations: Insulin - Basal: Noted patient refused Lantus 25 units last night. Therefore, patient has not received any basal insulin at all since being transitioned off the IV insulin drip last night. Fasting glucose is 253 mg/dl this morning. Please consider discontinuing current Lantus order and consider ordering Lantus 20 units daily to start now (based on 68.9 kg x 0.3 units). Correction (SSI): If patient is eating, please consider changing frequency of CBGs and Novolog to ACHS. A1C: A1C is still in process.  Thanks, Barnie Alderman, RN, MSN, CDE Diabetes Coordinator Inpatient Diabetes Program [phone] (Team Pager from [place] to [place]) [phone] ([place] office) [phone] [facility] office) [phone] [facility] office)

## 2015-02-11 NOTE — Plan of Care (Signed)
Problem: Education: Goal: Ability to manage health-related needs will improve Outcome: Not Progressing Patient refuses watch videos. States she watch them when her spouse when he was int he hospital.

## 2015-02-11 NOTE — Progress Notes (Signed)
Nutrition consult for New DM education received. Two attempts to provide today unsuccessful. Will follow up with her tomorrow.  Colman Cater MS,RD,CSG,LDN Office: [phone] Pager: [phone]

## 2015-02-11 NOTE — Progress Notes (Signed)
Paged Vanessa [name] about blood sugar of 90 while on insulin drip and 25 units of lantus ordered at bedtime; orders entered to d/c insulin drip, place patient on ssc with cbg every 4 hours; and to change IVF from d51/2ns to 1/2 ns@75mls /hr; explained all changes to the patient once her husband stepped out of the room; patient still does not want her husband and other family members to know of the condition she is currently being treated for; patient then refused bedtime lantus and blood sugar was not high enough to require SSC at this time; will continue to monitor and document any changes in pt condition

## 2015-02-12 LAB — BASIC METABOLIC PANEL
Anion gap: 7 (ref 5–15)
BUN: 16 mg/dL (ref 6–20)
CHLORIDE: 115 mmol/L — AB (ref 101–111)
CO2: 24 mmol/L (ref 22–32)
Calcium: 7.6 mg/dL — ABNORMAL LOW (ref 8.9–10.3)
Creatinine, Ser: 0.76 mg/dL (ref 0.44–1.00)
GFR calc Af Amer: >60 mL/min (ref >60)
GFR calc non Af Amer: >60 mL/min (ref >60)
Glucose, Bld: 258 mg/dL — ABNORMAL HIGH (ref 65–99)
POTASSIUM: 3.8 mmol/L (ref 3.5–5.1)
SODIUM: 146 mmol/L — AB (ref 135–145)

## 2015-02-12 LAB — GLUCOSE, CAPILLARY
GLUCOSE-CAPILLARY: 229 mg/dL — AB (ref 65–99)
Glucose-Capillary: 211 mg/dL — ABNORMAL HIGH (ref 65–99)
Glucose-Capillary: 225 mg/dL — ABNORMAL HIGH (ref 65–99)
Glucose-Capillary: 236 mg/dL — ABNORMAL HIGH (ref 65–99)
Glucose-Capillary: 253 mg/dL — ABNORMAL HIGH (ref 65–99)

## 2015-02-12 LAB — CBC
HEMATOCRIT: 32.1 % — AB (ref 36.0–46.0)
Hemoglobin: 10.3 g/dL — ABNORMAL LOW (ref 12.0–15.0)
MCH: 27.5 pg (ref 26.0–34.0)
MCHC: 32.1 g/dL (ref 30.0–36.0)
MCV: 85.8 fL (ref 78.0–100.0)
Platelets: 103 10*3/uL — ABNORMAL LOW (ref 150–400)
RBC: 3.74 MIL/uL — AB (ref 3.87–5.11)
RDW: 15.3 % (ref 11.5–15.5)
WBC: 7.6 10*3/uL (ref 4.0–10.5)

## 2015-02-12 MED ORDER — GLIPIZIDE 5 MG PO TABS: 5.0000 mg | ORAL_TABLET | Freq: Two times a day (BID) | ORAL | Status: DC

## 2015-02-12 MED ORDER — NYSTATIN 100000 UNIT/ML MT SUSP: 5.0000 mL | Freq: Four times a day (QID) | OROMUCOSAL | Status: DC

## 2015-02-12 MED ORDER — FLUCONAZOLE 100 MG PO TABS: 100.0000 mg | ORAL_TABLET | Freq: Every day | ORAL | Status: AC

## 2015-02-12 MED ORDER — METFORMIN HCL 1000 MG PO TABS: 1000.0000 mg | ORAL_TABLET | Freq: Two times a day (BID) | ORAL | Status: DC

## 2015-02-12 MED ORDER — FLUCONAZOLE 100 MG PO TABS: 100.0000 mg | ORAL_TABLET | Freq: Every day | ORAL | Status: DC

## 2015-02-12 MED ORDER — LISINOPRIL-HYDROCHLOROTHIAZIDE 20-12.5 MG PO TABS: 2.0000 | ORAL_TABLET | Freq: Every day | ORAL | Status: DC

## 2015-02-12 MED ORDER — AMLODIPINE BESYLATE 10 MG PO TABS: 10.0000 mg | ORAL_TABLET | Freq: Every day | ORAL | Status: DC

## 2015-02-12 MED ORDER — HEPARIN SOD (PORK) LOCK FLUSH 100 UNIT/ML IV SOLN
500.0000 [IU] | INTRAVENOUS | Status: DC | PRN
  Filled 2015-02-12: qty 5

## 2015-02-12 NOTE — Care Management Note (Signed)
Case Management Note  Patient Details  Name: Vanessa [name] MRN: [id number] Date of Birth: 1955-07-07  Subjective/Objective:                    Action/Plan:   Expected Discharge Date:                  Expected Discharge Plan:  Home/Self Care  In-House Referral:  NA  Discharge planning Services  CM Consult, Follow-up appt scheduled  Post Acute Care Choice:  NA Choice offered to:  NA  DME Arranged:    DME Agency:     HH Arranged:    [place] Agency:     Status of Service:  Completed, signed off  Medicare Important Message Given:    Date Medicare IM Given:    Medicare IM give by:    Date Additional Medicare IM Given:    Additional Medicare Important Message give by:     If discussed at [place] of Stay Meetings, dates discussed:    Additional Comments: Pt discharged home today. Follow up appt will be made for Dr. Dorris Fetch and documented on AVS prior to discharge. Pt will also be given script for glucometer. Pt refuses CM to make follow up appt for PCP at this time. Pt and pts nurse aware of discharge arrangements. Christinia Gully [place], RN 02/12/2015, 2:16 PM

## 2015-02-12 NOTE — Plan of Care (Signed)
Problem: Food- and Nutrition-Related Knowledge Deficit (NB-1.1) Goal: Nutrition education Formal process to instruct or train a patient/client in a skill or to impart knowledge to help patients/clients voluntarily manage or modify food choices and eating behavior to maintain or improve health. Outcome: Adequate for Discharge  RD consulted for nutrition education regarding diabetes.     Lab Results  Component Value Date    HGBA1C 12.9* 02/09/2015    RD provided "Carbohydrate Counting for People with Diabetes" handout from the Academy of [employer] and Dietetics. Discussed different food groups and their effects on blood sugar, emphasizing carbohydrate-containing foods. Provided list of carbohydrates and recommended serving sizes of common foods.  Discussed importance of controlled and consistent carbohydrate intake throughout the day. Provided examples of ways to balance meals/snacks and encouraged intake of high-fiber, whole grain complex carbohydrates. Teach back method used.  Expect good compliance.  Body mass index is 25.28 kg/(m^2). Pt meets criteria for overweight based on current BMI.  Current diet order is CHO modified, patient is consuming approximately 50% of meals at this time. Labs and medications reviewed. No further nutrition interventions warranted at this time. RD contact information provided. If additional nutrition issues arise, please re-consult RD. Colman Cater MS,RD,CSG,LDN Office: [phone] Pager: [phone]

## 2015-02-12 NOTE — Discharge Summary (Addendum)
Physician Discharge Summary  Vanessa [name] [id number] DOB: [birth year] DOA: 02/09/2015  PCP: Default, Provider, MD  Admit date: 02/09/2015 Discharge date: 02/12/2015  Time spent: 35 minutes  Recommendations for Outpatient Follow-up:  1. Follow up with PCP within 1-2 weeks for further diabetes management.  2. Referred to endocrinology for further workup.   Discharge Diagnoses:  Principal Problem:   DKA (diabetic ketoacidoses) ([place]) Active Problems:   Acute renal failure (ARF) (HCC)   Dehydration   New onset type 2 diabetes mellitus (HCC)   Elevated troponin   DKA, type 2 (HCC)   Hypernatremia   Discharge Condition: Improved    Diet recommendation: heart healthy   Filed Weights   02/09/15 1730 02/10/15 0400 02/11/15 0500  Weight: 65.5 kg (144 lb 6.4 oz) 65.7 kg (144 lb 13.5 oz) 68.9 kg (151 lb 14.4 oz)    History of present illness:  59 y.o. female with a history of hypertension who presents to hospital with complaints of generalized weakness. She reports over the past week she's developed worsening generalized weakness. She's been noticing that she is increasingly thirsty and drinking plenty of fluids. She's also had increased urination. She describes some weight loss over the past 2 weeks. She is chronically short of breath and feels that this has not changed. She's not had a fever or cough. She does describe some vague chest pain that occurred over the past 2 days. She has had nausea but no vomiting. She denies any stomach cramps. She denies any dysuria. She was evaluated in the emergency room where she was noted to be severely hyperglycemic with a blood sugar over 1300. She has evidence of acute renal failure diabetic ketoacidosis. She is admitted for further treatments.  Hospital Course:  [name] was found to have acute renal failure diabetic ketoacidosis upon admission and severely hyperglycemic with blood sugar of 1300. This is new onset of diabetes. She was started  on IVF and IV insulin with success in closing anion gap. Patient initially refused insulin and diabetes education and instead requested a trial of oral medications. Was started on metformin and glipizide. Her blood sugars have been in the low 200 range. Ideally, patient would be best treated with insulin therapy at this time, but she is extremely adamant about not taking insulin and wishes to only take oral medications at this time. I explained to her that this would likely be less effective and insulin, but she wishes to only take oral medications at this time. She will be referred to follow up with endocrinology who can hopefully help her achieve better/stable glucose control. Hgb A1C 12.9. Upon discharge she agreed to nutrition consult and diabetes education. On discharge blood sugars noted to be stable and she was felt to be stable for discharge. She was offered home health services, but has refused. 1. New onset diabetes. No family history of diabetes. She will need follow-up with primary care physician. Discuss with daughter the importance of diabetic compliance and education.  2. Acute renal failure,resolved. Related to dehydration. Improved with IV hydration.  3. Dehydration, resolved. Related to ketoacidosis. Continue IV fluids. 4. Hypernatremia, improving. Likely related to aggressive IV hydration with saline. Improving with half normal saline and monitor. 5. Elevated troponin. Suspect this is a demand ischemia in the setting of tachycardia due to #1 as well as decreased clearance in the setting of renal failure. Denied CP. Cycle troponin and EKG were unremarkable.   6. Oral thrush, improving. She is on nystatin and fluconazole.  Procedures:  None   Consultations:  Nutrition   Discharge Exam: Filed Vitals:   02/12/15 0500 02/12/15 1319  BP: 128/73 154/69  Pulse: 86 89  Temp: 98.4 F (36.9 C)   Resp: 20 20    General: NAD. Appears calm and looks comfortable Cardiovascular:  RRR, S1, S2  Respiratory: clear bilaterally, No wheezing, rales or rhonchi Abdomen: soft, non tender, no distention , bowel sounds normal Musculoskeletal: No edema b/l  Discharge Instructions   Discharge Instructions    Diet - low sodium heart healthy    Complete by:  As directed      Increase activity slowly    Complete by:  As directed           Current Discharge Medication List    START taking these medications   Details  fluconazole (DIFLUCAN) 100 MG tablet Take 1 tablet (100 mg total) by mouth daily. Qty: 14 tablet, Refills: 0    glipiZIDE (GLUCOTROL) 5 MG tablet Take 1 tablet (5 mg total) by mouth 2 (two) times daily before a meal. Qty: 60 tablet, Refills: 1    metFORMIN (GLUCOPHAGE) 1000 MG tablet Take 1 tablet (1,000 mg total) by mouth 2 (two) times daily with a meal. Qty: 60 tablet, Refills: 1    nystatin (MYCOSTATIN) 100000 UNIT/ML suspension Take 5 mLs (500,000 Units total) by mouth 4 (four) times daily. Qty: 60 mL, Refills: 0      CONTINUE these medications which have CHANGED   Details  lisinopril-hydrochlorothiazide (PRINZIDE,ZESTORETIC) 20-12.5 MG tablet Take 2 tablets by mouth daily. Restart after follow up with primary care doctor Qty: 60 tablet, Refills: 6      CONTINUE these medications which have NOT CHANGED   Details  amLODipine (NORVASC) 10 MG tablet Take 1 tablet (10 mg total) by mouth daily. Qty: 30 tablet, Refills: 3    aspirin 81 MG tablet Take 81 mg by mouth daily.    Calcium Carbonate-Vitamin D (CALCIUM + D PO) Take by mouth daily.      fish oil-omega-3 fatty acids 1000 MG capsule Take 1 g by mouth daily.      fluticasone (FLONASE) 50 MCG/ACT nasal spray Place 2 sprays into the nose as needed for allergies.     Multiple Vitamins-Minerals (CENTRUM SILVER PO) Take by mouth daily.      OVER THE COUNTER MEDICATION Take 1 capsule by mouth daily. Tumeric   Associated Diagnoses: Adenocarcinoma of cecum (HCC)    potassium chloride (KLOR-CON  M10) 10 MEQ tablet Take 1 tablet (10 mEq total) by mouth 2 (two) times daily. Qty: 60 tablet, Refills: 6    Simethicone (GAS RELIEF EXTRA STRENGTH PO) Take 1-2 tablets by mouth daily as needed (gas relief).    traMADol (ULTRAM) 50 MG tablet Take 1 tablet (50 mg total) by mouth every 6 (six) hours as needed. Qty: 60 tablet, Refills: 0      STOP taking these medications     ibuprofen (ADVIL,MOTRIN) 200 MG tablet        Allergies  Allergen Reactions  . Oxycodone-Acetaminophen     "burning up"       The results of significant diagnostics from this hospitalization (including imaging, microbiology, ancillary and laboratory) are listed below for reference.    Significant Diagnostic Studies: Dg Chest Portable 1 View  02/09/2015  CLINICAL DATA:  59 year old female with acute chest pain. EXAM: PORTABLE CHEST 1 VIEW COMPARISON:  09/28/2003 radiographs FINDINGS: The cardiomediastinal silhouette is unremarkable. A left Port-A-Cath is present with  tip overlying the lower SVC. There is no evidence of focal airspace disease, pulmonary edema, suspicious pulmonary nodule/mass, pleural effusion, or pneumothorax. No acute bony abnormalities are identified. IMPRESSION: No active disease. Electronically Signed   By: Margarette Canada M.D.   On: 02/09/2015 16:15    Microbiology: Recent Results (from the past 240 hour(s))  MRSA PCR Screening     Status: None   Collection Time: 02/09/15  6:05 PM  Result Value Ref Range Status   MRSA by PCR NEGATIVE NEGATIVE Final    Comment:        The GeneXpert MRSA Assay (FDA approved for NASAL specimens only), is one component of a comprehensive MRSA colonization surveillance program. It is not intended to diagnose MRSA infection nor to guide or monitor treatment for MRSA infections.      Labs: Basic Metabolic Panel:  Recent Labs Lab 02/10/15 0137 02/10/15 0458 02/10/15 1035 02/11/15 0935 02/12/15 0558  NA 165* 164* 155* 150* 146*  K 3.3* 4.4 4.4  4.3 3.8  CL 130* >130* 125* 120* 115*  CO2 25 25 25 25 24   GLUCOSE 194* 314* 574* 394* 258*  BUN 37* 36* 37* 26* 16  CREATININE 1.31* 1.28* 1.34* 0.99 0.76  CALCIUM 8.9 8.6* 8.1* 8.0* 7.6*   Liver Function Tests:  Recent Labs Lab 02/09/15 1549  AST 28  ALT 31  ALKPHOS 118  BILITOT 0.9  PROT 9.3*  ALBUMIN 4.9  CBC:  Recent Labs Lab 02/09/15 1549 02/09/15 1838 02/10/15 0458 02/11/15 0935 02/12/15 0558  WBC 13.6* 13.0* 14.8* 11.5* 7.6  NEUTROABS 11.8*  --   --   --   --   HGB 15.8* 14.0 12.7 11.1* 10.3*  HCT 53.5* 43.8 39.2 35.5* 32.1*  MCV 95.5 88.1 84.5 86.0 85.8  PLT 295 228 161 126* 103*   Cardiac Enzymes:  Recent Labs Lab 02/09/15 1549 02/09/15 1838 02/10/15 0137 02/10/15 0458  TROPONINI 0.04* 0.05* 0.13* 0.13*    CBG:  Recent Labs Lab 02/11/15 2101 02/12/15 0028 02/12/15 0444 02/12/15 0739 02/12/15 1132  GLUCAP 274* 236* 229* 211* 225*      Signed:  Kathie Dike, MD  [facility] 02/12/2015, 1:59 PM    By signing my name below, I, Rennis Harding, attest that this documentation has been prepared under the direction and in the presence of Kathie Dike, MD. Electronically signed: Rennis Harding, Scribe. 02/12/2015 1:30pm  I, Dr. Kathie Dike, personally performed the services described in this documentaiton. All medical record entries made by the scribe were at my direction and in my presence. I have reviewed the chart and agree that the record reflects my personal performance and is accurate and complete  Kathie Dike, MD, 02/12/2015 1:59 PM

## 2015-03-13 ENCOUNTER — Ambulatory Visit (INDEPENDENT_AMBULATORY_CARE_PROVIDER_SITE_OTHER): Payer: BLUE CROSS/BLUE SHIELD | Admitting: "Endocrinology

## 2015-03-13 ENCOUNTER — Encounter: Payer: Self-pay | Admitting: "Endocrinology

## 2015-03-13 VITALS — Ht 65.0 in

## 2015-03-13 DIAGNOSIS — E1165 Type 2 diabetes mellitus with hyperglycemia: Secondary | ICD-10-CM | POA: Diagnosis not present

## 2015-03-13 DIAGNOSIS — IMO0002 Reserved for concepts with insufficient information to code with codable children: Secondary | ICD-10-CM | POA: Insufficient documentation

## 2015-03-13 DIAGNOSIS — E1169 Type 2 diabetes mellitus with other specified complication: Secondary | ICD-10-CM | POA: Diagnosis not present

## 2015-03-13 MED ORDER — METFORMIN HCL 500 MG PO TABS: 500.0000 mg | ORAL_TABLET | Freq: Two times a day (BID) | ORAL | Status: AC

## 2015-03-13 MED ORDER — METFORMIN HCL 500 MG PO TABS: 500.0000 mg | ORAL_TABLET | Freq: Two times a day (BID) | ORAL | Status: DC

## 2015-03-13 NOTE — Progress Notes (Signed)
Subjective:    Patient ID: Vanessa [name], female    DOB: 1955-05-25. Patient is being seen in consultation for management of diabetes requested by  Default, Provider, MD  Past Medical History  Diagnosis Date  . Anemia   . Peptic ulcer   . Colon cancer ([place])   . Gall bladder disease     inflammation  . Yeast infection involving the vagina and surrounding area   . Abdominal pain, other specified site   . Arthritis   . Fibroids   . Adenocarcinoma of cecum ([place]) 01/15/2014  . Hypertension    Past Surgical History  Procedure Laterality Date  . Colon surgery    . Egd/tcs    . Bilateral nipple resections    . Prot-a-cath placement    . Colonoscopy    . Colonoscopy N/A 11/29/2013    Procedure: COLONOSCOPY;  Surgeon: Rogene Houston, MD;  Location: [facility];  Service: Endoscopy;  Laterality: N/A;  1030   Social History   Social History  . Marital Status: Married    Spouse Name: N/A  . Number of Children: N/A  . Years of Education: N/A   Social History Main Topics  . Smoking status: Never Smoker   . Smokeless tobacco: Never Used  . Alcohol Use: No  . Drug Use: No  . Sexual Activity: Not Currently    Birth Control/ Protection: Post-menopausal   Other Topics Concern  . None   Social History Narrative   Outpatient Encounter Prescriptions as of 03/13/2015  Medication Sig  . amLODipine (NORVASC) 10 MG tablet Take 1 tablet (10 mg total) by mouth daily.  Marland Kitchen aspirin 81 MG tablet Take 81 mg by mouth daily.  . Calcium Carbonate-Vitamin D (CALCIUM + D PO) Take by mouth daily.    . cyclobenzaprine (FLEXERIL) 10 MG tablet Take 10 mg by mouth 3 (three) times daily as needed for muscle spasms.  . fish oil-omega-3 fatty acids 1000 MG capsule Take 1 g by mouth daily.    Marland Kitchen lisinopril-hydrochlorothiazide (PRINZIDE,ZESTORETIC) 20-12.5 MG tablet Take 2 tablets by mouth daily. Restart after follow up with primary care doctor  . metFORMIN (GLUCOPHAGE) 500 MG tablet Take 1 tablet  (500 mg total) by mouth 2 (two) times daily with a meal.  . Multiple Vitamins-Minerals (CENTRUM SILVER PO) Take by mouth daily.    . naproxen (NAPROSYN) 500 MG tablet Take 500 mg by mouth 2 (two) times daily with a meal.  . potassium chloride (KLOR-CON M10) 10 MEQ tablet Take 1 tablet (10 mEq total) by mouth 2 (two) times daily.  . traMADol (ULTRAM) 50 MG tablet Take 1 tablet (50 mg total) by mouth every 6 (six) hours as needed.  . TURMERIC PO Take by mouth daily.  . [DISCONTINUED] glipiZIDE (GLUCOTROL) 5 MG tablet Take 1 tablet (5 mg total) by mouth 2 (two) times daily before a meal.  . [DISCONTINUED] metFORMIN (GLUCOPHAGE) 1000 MG tablet Take 1 tablet (1,000 mg total) by mouth 2 (two) times daily with a meal.  . [DISCONTINUED] metFORMIN (GLUCOPHAGE) 500 MG tablet Take 1 tablet (500 mg total) by mouth 2 (two) times daily with a meal.  . fluconazole (DIFLUCAN) 100 MG tablet Take 1 tablet (100 mg total) by mouth daily.  . fluticasone (FLONASE) 50 MCG/ACT nasal spray Place 2 sprays into the nose as needed for allergies.   Marland Kitchen nystatin (MYCOSTATIN) 100000 UNIT/ML suspension Take 5 mLs (500,000 Units total) by mouth 4 (four) times daily.  . Simethicone (  GAS RELIEF EXTRA STRENGTH PO) Take 1-2 tablets by mouth daily as needed (gas relief).  . [DISCONTINUED] OVER THE COUNTER MEDICATION Take 1 capsule by mouth daily. Tumeric   No facility-administered encounter medications on file as of 03/13/2015.   ALLERGIES: Allergies  Allergen Reactions  . Oxycodone-Acetaminophen     "burning up"    VACCINATION STATUS:  There is no immunization history on file for this patient.  Diabetes She presents for her initial diabetic visit. She has type 2 diabetes mellitus. Onset time: She was diagnosed last month, although she did have prediabetes for at least 5 years. Her disease course has been improving (After she was initiated on metformin and glipizide her glycemic profile has been improving). There are no  hypoglycemic associated symptoms. Pertinent negatives for hypoglycemia include no confusion, headaches, pallor or seizures. There are no diabetic associated symptoms. Pertinent negatives for diabetes include no chest pain, no polydipsia, no polyphagia and no polyuria. There are no hypoglycemic complications. Symptoms are improving. There are no diabetic complications. Risk factors for coronary artery disease include diabetes mellitus, hypertension and sedentary lifestyle. Current diabetic treatment includes oral agent (dual therapy). She is compliant with treatment most of the time. Her weight is decreasing steadily. She is following a generally unhealthy diet. When asked about meal planning, she reported none. She has not had a previous visit with a dietitian. She never participates in exercise. Her home blood glucose trend is decreasing steadily. Her overall blood glucose range is 140-180 mg/dl. An ACE inhibitor/angiotensin II receptor blocker is being taken.  Hypertension This is a chronic problem. The current episode started more than 1 year ago. Pertinent negatives include no chest pain, headaches, palpitations or shortness of breath. Risk factors for coronary artery disease include diabetes mellitus. Past treatments include ACE inhibitors.    Review of Systems  Constitutional: Positive for unexpected weight change. Negative for fever and chills.       She lost approximately 15 pounds over the last year.   HENT: Negative for trouble swallowing and voice change.   Eyes: Negative for visual disturbance.  Respiratory: Negative for cough, shortness of breath and wheezing.   Cardiovascular: Negative for chest pain, palpitations and leg swelling.  Gastrointestinal: Negative for nausea, vomiting and diarrhea.  Endocrine: Negative for cold intolerance, heat intolerance, polydipsia, polyphagia and polyuria.  Musculoskeletal: Negative for myalgias and arthralgias.  Skin: Negative for color change, pallor,  rash and wound.  Neurological: Negative for seizures and headaches.  Psychiatric/Behavioral: Negative for suicidal ideas and confusion.    Objective:    Ht 5\' 5"  (1.651 m)  Wt Readings from Last 3 Encounters:  02/11/15 151 lb 14.4 oz (68.9 kg)  12/11/14 164 lb (74.39 kg)  11/30/14 162 lb 9.6 oz (73.755 kg)    Physical Exam  Constitutional: She is oriented to person, place, and time. She appears well-developed.  HENT:  Head: Normocephalic and atraumatic.  Eyes: EOM are normal.  Neck: Normal range of motion. Neck supple. No tracheal deviation present. No thyromegaly present.  Cardiovascular: Normal rate and regular rhythm.   Pulmonary/Chest: Effort normal and breath sounds normal.  Abdominal: Soft. Bowel sounds are normal. There is no tenderness. There is no guarding.  Musculoskeletal: Normal range of motion. She exhibits no edema.  Neurological: She is alert and oriented to person, place, and time. She has normal reflexes. No cranial nerve deficit. Coordination normal.  Skin: Skin is warm and dry. No rash noted. No erythema. No pallor.  Psychiatric: She has a  normal mood and affect. Judgment normal.     CMP ( most recent) CMP     Component Value Date/Time   NA 146* 02/12/2015 0558   K 3.8 02/12/2015 0558   CL 115* 02/12/2015 0558   CO2 24 02/12/2015 0558   GLUCOSE 258* 02/12/2015 0558   BUN 16 02/12/2015 0558   CREATININE 0.76 02/12/2015 0558   CALCIUM 7.6* 02/12/2015 0558   PROT 9.3* 02/09/2015 1549   ALBUMIN 4.9 02/09/2015 1549   AST 28 02/09/2015 1549   ALT 31 02/09/2015 1549   ALKPHOS 118 02/09/2015 1549   BILITOT 0.9 02/09/2015 1549   GFRNONAA >60 02/12/2015 0558   GFRAA >60 02/12/2015 0558     Diabetic Labs (most recent): Lab Results  Component Value Date   HGBA1C 12.9* 02/09/2015   HGBA1C 6.2* 09/09/2010     Lipid Panel ( most recent) Lipid Panel     Component Value Date/Time   CHOL * 09/14/2008 0910    247        ATP III CLASSIFICATION:  <200      mg/dL   Desirable  200-239  mg/dL   Borderline High  >=240    mg/dL   High          TRIG 70 09/14/2008 0910   HDL 57 09/14/2008 0910   CHOLHDL 4.3 09/14/2008 0910   VLDL 14 09/14/2008 0910   LDLCALC * 09/14/2008 0910    176        Total Cholesterol/HDL:CHD Risk Coronary Heart Disease Risk Table                     Men   Women  1/2 Average Risk   3.4   3.3  Average Risk       5.0   4.4  2 X Average Risk   9.6   7.1  3 X Average Risk  23.4   11.0        Use the calculated Patient Ratio above and the CHD Risk Table to determine the patient's CHD Risk.        ATP III CLASSIFICATION (LDL):  <100     mg/dL   Optimal  100-129  mg/dL   Near or Above                    Optimal  130-159  mg/dL   Borderline  160-189  mg/dL   High  >190     mg/dL   Very High       Assessment & Plan:   1. Uncontrolled type 2 diabetes mellitus with other specified complication [facility])  - Patient has currently uncontrolled symptomatic type 2 DM since  60 years of age,  with most recent A1c of 12.9 %. Recent glucose profile reviewed and are all on target with one mild hypoglycemia of 64. - complications of diabetes which include CAD, CVA, CKD, retinopathy, and neuropathy are all discussed in detail with the patient.  - I have counseled the patient on diet management and weight loss, by adopting a carbohydrate restricted/protein rich diet.  - Suggestion is made for patient to avoid simple carbohydrates   from their diet including Cakes , Desserts, Ice Cream,  Soda (  diet and regular) , Sweet Tea , Candies,  Chips, Cookies, Artificial Sweeteners,   and "Sugar-free" Products . This will help patient to have stable blood glucose profile and potentially avoid unintended weight gain.  - I encouraged the patient to  switch to  unprocessed or minimally processed complex starch and increased protein intake (animal or plant source), fruits, and vegetables.  - Patient is advised to stick to a routine mealtimes to  eat 3 meals  a day and avoid unnecessary snacks ( to snack only to correct hypoglycemia).  - The patient will be scheduled with Jearld Fenton, RDN, CDE for individualized DM education.  - I have approached patient with the following individualized plan to manage diabetes and patient agrees:   - I will continue metformin 500 mg by mouth twice a day, therapeutically suitable for patient.. - I will discontinue her glipizide, risk outweighs benefit for this patient.   - Patient specific target  A1c;  LDL, HDL, Triglycerides, and  Waist Circumference were discussed in detail.  2) BP/HTN: Controlled. Continue current medications including ACEI/ARB. 3) Lipids/HPL:  Uncontrolled with LDL 176, she will be considered for a low-dose statin. I advised her to continue omega-3 fatty acids. 4)  Weight/Diet: CDE Consult will be initiated , exercise, and detailed carbohydrates information provided.  5) Chronic Care/Health Maintenance:  -Patient is on ACEI/ARB and Statin medications and encouraged to continue to follow up with Ophthalmology, Podiatrist at least yearly or according to recommendations, and advised to   stay away from smoking. I have recommended yearly flu vaccine and pneumonia vaccination at least every 5 years; moderate intensity exercise for up to 150 minutes weekly; and  sleep for at least 7 hours a day.  - 60 minutes of time was spent on the care of this patient , 50% of which was applied for counseling on diabetes complications and their preventions.  - Patient to bring meter and  blood glucose logs during their next visit.   - I advised patient to maintain close follow up with Default, Provider, MD for primary care needs.  Follow up plan: - Return in about 10 weeks (around 05/22/2015) for diabetes, follow up with meter and logs- no labs.  Glade Lloyd, MD Phone: [phone]  Fax: [phone]   03/13/2015, 10:37 AM

## 2015-03-13 NOTE — Patient Instructions (Signed)

## 2015-04-17 ENCOUNTER — Other Ambulatory Visit: Payer: Self-pay | Admitting: *Deleted

## 2015-04-18 ENCOUNTER — Other Ambulatory Visit: Payer: Self-pay | Admitting: Obstetrics and Gynecology

## 2015-04-18 DIAGNOSIS — E119 Type 2 diabetes mellitus without complications: Secondary | ICD-10-CM

## 2015-04-18 MED ORDER — AMLODIPINE BESYLATE 10 MG PO TABS: 10.0000 mg | ORAL_TABLET | Freq: Every day | ORAL | Status: DC

## 2015-04-18 MED ORDER — AMLODIPINE BESYLATE 10 MG PO TABS: 10.0000 mg | ORAL_TABLET | Freq: Every day | ORAL | Status: AC

## 2015-05-23 ENCOUNTER — Ambulatory Visit: Payer: BLUE CROSS/BLUE SHIELD | Admitting: "Endocrinology

## 2015-08-05 DIAGNOSIS — E782 Mixed hyperlipidemia: Secondary | ICD-10-CM | POA: Diagnosis not present

## 2015-08-05 DIAGNOSIS — E1165 Type 2 diabetes mellitus with hyperglycemia: Secondary | ICD-10-CM | POA: Diagnosis not present

## 2015-08-07 DIAGNOSIS — E782 Mixed hyperlipidemia: Secondary | ICD-10-CM | POA: Diagnosis not present

## 2015-08-07 DIAGNOSIS — I10 Essential (primary) hypertension: Secondary | ICD-10-CM | POA: Diagnosis not present

## 2015-08-07 DIAGNOSIS — E1165 Type 2 diabetes mellitus with hyperglycemia: Secondary | ICD-10-CM | POA: Diagnosis not present

## 2015-11-27 ENCOUNTER — Other Ambulatory Visit: Payer: Self-pay | Admitting: Obstetrics and Gynecology

## 2015-11-27 DIAGNOSIS — Z1231 Encounter for screening mammogram for malignant neoplasm of breast: Secondary | ICD-10-CM

## 2015-12-02 ENCOUNTER — Other Ambulatory Visit (HOSPITAL_COMMUNITY): Payer: Self-pay | Admitting: *Deleted

## 2015-12-02 DIAGNOSIS — C180 Malignant neoplasm of cecum: Secondary | ICD-10-CM

## 2015-12-03 ENCOUNTER — Other Ambulatory Visit (HOSPITAL_COMMUNITY): Payer: 59

## 2015-12-03 ENCOUNTER — Ambulatory Visit (HOSPITAL_COMMUNITY): Payer: Self-pay | Admitting: Oncology

## 2015-12-03 NOTE — Progress Notes (Signed)
No Show

## 2015-12-05 ENCOUNTER — Ambulatory Visit (HOSPITAL_COMMUNITY)
Admission: RE | Admit: 2015-12-05 | Discharge: 2015-12-05 | Disposition: A | Discharge status: Home / Self Care | Payer: BLUE CROSS/BLUE SHIELD | Source: Ambulatory Visit | Attending: Obstetrics and Gynecology | Admitting: Obstetrics and Gynecology

## 2015-12-05 DIAGNOSIS — Z1231 Encounter for screening mammogram for malignant neoplasm of breast: Secondary | ICD-10-CM | POA: Diagnosis not present

## 2015-12-20 ENCOUNTER — Encounter (HOSPITAL_COMMUNITY): Payer: BLUE CROSS/BLUE SHIELD

## 2015-12-20 ENCOUNTER — Encounter (HOSPITAL_COMMUNITY): Payer: BLUE CROSS/BLUE SHIELD | Attending: Oncology | Admitting: Oncology

## 2015-12-20 ENCOUNTER — Encounter (HOSPITAL_COMMUNITY): Payer: Self-pay | Admitting: Oncology

## 2015-12-20 VITALS — BP 144/65 | HR 82 | Temp 98.1°F | Resp 18 | Wt 144.5 lb

## 2015-12-20 DIAGNOSIS — E876 Hypokalemia: Secondary | ICD-10-CM

## 2015-12-20 DIAGNOSIS — C180 Malignant neoplasm of cecum: Secondary | ICD-10-CM | POA: Insufficient documentation

## 2015-12-20 DIAGNOSIS — Z85038 Personal history of other malignant neoplasm of large intestine: Secondary | ICD-10-CM | POA: Diagnosis not present

## 2015-12-20 LAB — COMPREHENSIVE METABOLIC PANEL
ALBUMIN: 3.9 g/dL (ref 3.5–5.0)
ALT: 17 U/L (ref 14–54)
ANION GAP: 6 (ref 5–15)
AST: 22 U/L (ref 15–41)
Alkaline Phosphatase: 63 U/L (ref 38–126)
BILIRUBIN TOTAL: 0.5 mg/dL (ref 0.3–1.2)
BUN: 16 mg/dL (ref 6–20)
CALCIUM: 9.1 mg/dL (ref 8.9–10.3)
CO2: 29 mmol/L (ref 22–32)
Chloride: 104 mmol/L (ref 101–111)
Creatinine, Ser: 0.88 mg/dL (ref 0.44–1.00)
GLUCOSE: 72 mg/dL (ref 65–99)
POTASSIUM: 3.3 mmol/L — AB (ref 3.5–5.1)
Sodium: 139 mmol/L (ref 135–145)
TOTAL PROTEIN: 7.2 g/dL (ref 6.5–8.1)

## 2015-12-20 LAB — CBC WITH DIFFERENTIAL/PLATELET
BASOS PCT: 1 %
Basophils Absolute: 0 10*3/uL (ref 0.0–0.1)
Eosinophils Absolute: 0.2 10*3/uL (ref 0.0–0.7)
Eosinophils Relative: 2 %
HEMATOCRIT: 38.2 % (ref 36.0–46.0)
Hemoglobin: 12.4 g/dL (ref 12.0–15.0)
LYMPHS ABS: 2.7 10*3/uL (ref 0.7–4.0)
Lymphocytes Relative: 42 %
MCH: 27.7 pg (ref 26.0–34.0)
MCHC: 32.5 g/dL (ref 30.0–36.0)
MCV: 85.5 fL (ref 78.0–100.0)
MONO ABS: 0.6 10*3/uL (ref 0.1–1.0)
MONOS PCT: 9 %
NEUTROS ABS: 3 10*3/uL (ref 1.7–7.7)
Neutrophils Relative %: 46 %
Platelets: 211 10*3/uL (ref 150–400)
RBC: 4.47 MIL/uL (ref 3.87–5.11)
RDW: 14.8 % (ref 11.5–15.5)
WBC: 6.5 10*3/uL (ref 4.0–10.5)

## 2015-12-20 MED ORDER — POTASSIUM CHLORIDE CRYS ER 10 MEQ PO TBCR: 10.0000 meq | EXTENDED_RELEASE_TABLET | Freq: Two times a day (BID) | ORAL | 6 refills | Status: AC

## 2015-12-20 NOTE — Patient Instructions (Signed)
[place] at [facility] Discharge Instructions  RECOMMENDATIONS MADE BY THE CONSULTANT AND ANY TEST RESULTS WILL BE SENT TO YOUR REFERRING PHYSICIAN.  You were seen by Gershon Mussel today. Mammogram in 12 months  Return for follow up in 12 months.  Thank you for choosing [place] at [facility] to provide your oncology and hematology care.  To afford each patient quality time with our provider, please arrive at least 15 minutes before your scheduled appointment time.   Beginning January 23rd 2017 lab work for the [facility] will be done in the  Main lab at [facility] on 1st floor. If you have a lab appointment with the [place] please come in thru the  Main Entrance and check in at the main information desk  You need to re-schedule your appointment should you arrive 10 or more minutes late.  We strive to give you quality time with our providers, and arriving late affects you and other patients whose appointments are after yours.  Also, if you no show three or more times for appointments you may be dismissed from the clinic at the providers discretion.     Again, thank you for choosing [facility].  Our hope is that these requests will decrease the amount of time that you wait before being seen by our physicians.       _____________________________________________________________  Should you have questions after your visit to [facility], please contact our office at [phone] between the hours of 8:30 a.m. and 4:30 p.m.  Voicemails left after 4:30 p.m. will not be returned until the following business day.  For prescription refill requests, have your pharmacy contact our office.         Resources For Cancer Patients and their Caregivers ? American Cancer Society: Can assist with transportation, wigs, general needs, runs Look Good Feel Better.        [phone] ? Cancer Care: Provides financial  assistance, online support groups, medication/co-pay assistance.  1-800-813-HOPE 613-863-1393) ? [place] Assists [place] cancer patients and their families through emotional , educational and financial support.  832-468-7258 ? [place] DSS Where to apply for food stamps, Medicaid and utility assistance. (719)676-0670 ? [facility]: Transportation to medical appointments. 620-583-6300 ? Social Security Administration: May apply for disability if have a Stage IV cancer. 302-283-7585 970-769-6260 ? [employer], Disability and Transit Services: Assists with nutrition, care and transit needs. [place] Support Programs: @10RELATIVEDAYS @ > Cancer Support Group  2nd Tuesday of the month 1pm-2pm, Journey Room  > Creative Journey  3rd Tuesday of the month 1130am-1pm, Journey Room  > Look Good Feel Better  1st Wednesday of the month 10am-12 noon, Journey Room (Call [facility] to register [phone])

## 2015-12-20 NOTE — Progress Notes (Signed)
Wende Neighbors, MD [place] [address]  Adenocarcinoma of cecum [facility]) - Plan: CBC with Differential, Comprehensive metabolic panel  Hypokalemia - Plan: potassium chloride (KLOR-CON M10) 10 MEQ tablet  CURRENT THERAPY: Surveillance per NCCN guidelines  INTERVAL HISTORY: ERICK OCHELTREE 60 y.o. female returns for followup of stage III colon cancer. She was diagnosed and treated back in 2005.    Adenocarcinoma of cecum ([place])   09/01/2003 Definitive Surgery    Right cecal mass identified as an infiltrating adenocarcinoma measuring 8.4 cm with tumor extending through the muscularis propria into pericolic fat, LVI not observed, but 5/14 positive lymph nodes      10/02/2003 - 01/15/2004 Chemotherapy    Oxaliplatin, Xeloda , and Avastin (despite Stage III disease) x 6 cycles      01/04/2004 Remission    CT CAP- no evidence of disease      10/27/2011 Imaging    CT C/A/P without recurrent disease       She was diagnosed with DM.  She is now on medications for this.  She fortunately has establishedherself with a primary care provider and is seeing Dr. Nevada Crane.  I have added him to the patient's care team.  She is seeing Dr. Dorris Fetch for her DM care.  She notes aches in her shoulders that is chronic.  She is using OTC medication for this.    She notes a new sore throat that is "scratchy."  She denies any URI symptoms at this time.  Review of Systems  Constitutional: Negative.  Negative for chills, fever and weight loss.  HENT: Positive for sore throat.   Eyes: Negative for blurred vision and double vision.  Respiratory: Negative.  Negative for cough and hemoptysis.   Cardiovascular: Negative.  Negative for chest pain.  Gastrointestinal: Negative.  Negative for abdominal pain, blood in stool, constipation, diarrhea, melena, nausea and vomiting.  Genitourinary: Negative.  Negative for dysuria.  Musculoskeletal: Positive for joint pain (Shoulder).  Skin: Negative.    Neurological: Negative.   Endo/Heme/Allergies: Negative.   Psychiatric/Behavioral: Negative.     Past Medical History:  Diagnosis Date  . Abdominal pain, other specified site   . Adenocarcinoma of cecum ([place]) 01/15/2014  . Anemia   . Arthritis   . Colon cancer ([place])   . Fibroids   . Gall bladder disease    inflammation  . Hypertension   . Peptic ulcer   . Yeast infection involving the vagina and surrounding area     Past Surgical History:  Procedure Laterality Date  . bilateral nipple resections    . COLON SURGERY    . COLONOSCOPY    . COLONOSCOPY N/A 11/29/2013   Procedure: COLONOSCOPY;  Surgeon: Rogene Houston, MD;  Location: [facility];  Service: Endoscopy;  Laterality: N/A;  1030  . egd/tcs    . prot-a-cath placement      Family History  Problem Relation Age of Onset  . Cancer Mother     lung cancer  . Heart attack Brother   . Kidney disease Sister   . Thyroid nodules Daughter   . Fibroids Sister     Social History   Social History  . Marital status: Married    Spouse name: N/A  . Number of children: N/A  . Years of education: N/A   Social History Main Topics  . Smoking status: Never Smoker  . Smokeless tobacco: Never Used  . Alcohol use No  .  Drug use: No  . Sexual activity: Not Currently    Birth control/ protection: Post-menopausal   Other Topics Concern  . None   Social History Narrative  . None     PHYSICAL EXAMINATION  ECOG PERFORMANCE STATUS: 0 - Asymptomatic  Vitals:   12/20/15 1100  BP: (!) 144/65  Pulse: 82  Resp: 18  Temp: 98.1 F (36.7 C)    GENERAL:alert, no distress, well nourished, well developed, comfortable, cooperative, smiling and unaccompanied SKIN: skin color, texture, turgor are normal, no rashes or significant lesions HEAD: Normocephalic, No masses, lesions, tenderness or abnormalities EYES: normal, EOMI, Conjunctiva are pink and non-injected EARS: External ears normal OROPHARYNX:lips, buccal mucosa,  and tongue normal and mucous membranes are moist  NECK: supple, trachea midline LYMPH:  no palpable lymphadenopathy BREAST:not examined LUNGS: clear to auscultation and percussion HEART: regular rate & rhythm ABDOMEN:abdomen soft and normal bowel sounds BACK: Back symmetric, no curvature. EXTREMITIES:less then 2 second capillary refill, no joint deformities, effusion, or inflammation, no edema, no skin discoloration, no clubbing, no cyanosis  NEURO: alert & oriented x 3 with fluent speech, no focal motor/sensory deficits, gait normal   LABORATORY DATA: CBC    Component Value Date/Time   WBC 6.5 12/20/2015 1057   RBC 4.47 12/20/2015 1057   HGB 12.4 12/20/2015 1057   HCT 38.2 12/20/2015 1057   PLT 211 12/20/2015 1057   MCV 85.5 12/20/2015 1057   MCH 27.7 12/20/2015 1057   MCHC 32.5 12/20/2015 1057   RDW 14.8 12/20/2015 1057   LYMPHSABS 2.7 12/20/2015 1057   MONOABS 0.6 12/20/2015 1057   EOSABS 0.2 12/20/2015 1057   BASOSABS 0.0 12/20/2015 1057      Chemistry      Component Value Date/Time   NA 139 12/20/2015 1057   K 3.3 (L) 12/20/2015 1057   CL 104 12/20/2015 1057   CO2 29 12/20/2015 1057   BUN 16 12/20/2015 1057   CREATININE 0.88 12/20/2015 1057      Component Value Date/Time   CALCIUM 9.1 12/20/2015 1057   ALKPHOS 63 12/20/2015 1057   AST 22 12/20/2015 1057   ALT 17 12/20/2015 1057   BILITOT 0.5 12/20/2015 1057        PENDING LABS:   RADIOGRAPHIC STUDIES:  Mm Screening Breast Tomo Bilateral  Result Date: 12/09/2015 CLINICAL DATA:  Screening. EXAM: 2D DIGITAL SCREENING BILATERAL MAMMOGRAM WITH CAD AND ADJUNCT TOMO COMPARISON:  Previous exam(s). ACR Breast Density Category c: The breast tissue is heterogeneously dense, which may obscure small masses. FINDINGS: There are no findings suspicious for malignancy. Images were processed with CAD. IMPRESSION: No mammographic evidence of malignancy. A result letter of this screening mammogram will be mailed directly  to the patient. RECOMMENDATION: Screening mammogram in one year. (Code:SM-B-01Y) BI-RADS CATEGORY  1: Negative. Electronically Signed   By: Fidela Salisbury M.D.   On: 12/09/2015 08:39     PATHOLOGY:    ASSESSMENT AND PLAN:  Adenocarcinoma of cecum ([place]) Stage III colon cancer. She was diagnosed and treated back in 2005.  She is up to date on colonoscopy.  Her last one by Dr. Laural Golden was in 2015.  She will be due for her next colonoscopy in 2019.  Labs today: CBC diff, CMET.  I personally reviewed and went over laboratory results with the patient.  The results are noted within this dictation.  She is educated on the role of CEA monitoring.  She is > 5 years out from her dx and treatment and therefore,  routine CEA monitoring is no longer indicated per NCCN guidelines.  Hypokalemia is noted and she is encouraged to increase her K+ to TID for 2 weeks and then return to BID dosing.    New Kdur Rx is printed for the patient.  I personally reviewed and went over radiographic studies with the patient.  The results are noted within this dictation.  Mammogram on 12/09/2015 was BIRADS 1.  She will be due for her next screening mammogram in October 2018.  She follows with Dr. Dorris Fetch for her diabetes management.  She sees Dr. Glo Herring for her Gyn needs.  She has established herself with a primary care provider which is long overdue.  She sees Dr. Nevada Crane and colleagues.    Return in 1 year for follow-up.   ORDERS PLACED FOR THIS ENCOUNTER: Orders Placed This Encounter  Procedures  . CBC with Differential  . Comprehensive metabolic panel    MEDICATIONS PRESCRIBED THIS ENCOUNTER: Meds ordered this encounter  Medications  . JARDIANCE 25 MG TABS tablet    Refill:  0  . lisinopril-hydrochlorothiazide (PRINZIDE,ZESTORETIC) 10-12.5 MG tablet    Sig: Take 1 tablet by mouth daily.    Refill:  0  . NAPROXEN DR 500 MG EC tablet    Refill:  0  . DISCONTD: potassium chloride (K-DUR) 10 MEQ tablet     Refill:  0  . glipiZIDE (GLUCOTROL) 10 MG tablet    Sig: Take 10 mg by mouth 2 (two) times daily.  . potassium chloride (KLOR-CON M10) 10 MEQ tablet    Sig: Take 1 tablet (10 mEq total) by mouth 2 (two) times daily.    Dispense:  60 tablet    Refill:  6    Order Specific Question:   Supervising Provider    Answer:   Patrici Ranks [id number]    THERAPY PLAN:  NCCN guidelines for surveillance for Colon cancer are as follows (1.2017):  A. Stage I   1. Colonoscopy at year 1    A. If advanced adenoma, repeat in 1 year    B. If no advanced adenoma, repeat in 3 years, and then every 5 years.  B. Stage II, Stage III   1. H+P every 3-6 months x 2 years and then every 6 months for a total of 5 years    2. CEA every 3-6 months x 2 years and then every 6 months for a total of 5 years    3. CT CAP every 6-12 months (category 2B for frequency < 12 months) for a total of 5 years .   4.  Colonoscopy in 1 year except if no preoperative colonoscopy due to obstructing lesion, colonoscopy in 3-6 months.     A. If advanced adenoma, repeat in 1 year    B. If no advanced adenoma, repeat in 3 years, then every 5 years   5. PET/CT scan is not recommended.  C. Stage IV   1. H+P every 3-6 months x 2 years and then every 6 months for a total of 5 years    2. CEA every 3 months x 2 years and then every 6 months for a total of 3- 5 years    3. CT CAP every 3-6 months (category 2B for frequency < 6 months) x 2 years., then every 6-12 months for a total of 5 years .   4. Colonoscopy in 1 year except if no preoperative colonoscopy due to obstructing lesion, colonoscopy in 3-6 months.  A. If advanced adenoma, repeat in 1 year    B. If no advanced adenoma, repeat in 3 years, then every 5 years     All questions were answered. The patient knows to call the clinic with any problems, questions or concerns. We can certainly see the patient much sooner if necessary.  Patient and plan discussed with Dr. Ancil Linsey and she is in agreement with the aforementioned.   This note is electronically signed by: Doy Mince 12/20/2015 5:43 PM

## 2015-12-20 NOTE — Assessment & Plan Note (Addendum)
Stage III colon cancer. She was diagnosed and treated back in 2005.  She is up to date on colonoscopy.  Her last one by Dr. Laural Golden was in 2015.  She will be due for her next colonoscopy in 2019.  Labs today: CBC diff, CMET.  I personally reviewed and went over laboratory results with the patient.  The results are noted within this dictation.  She is educated on the role of CEA monitoring.  She is > 5 years out from her dx and treatment and therefore, routine CEA monitoring is no longer indicated per NCCN guidelines.  Hypokalemia is noted and she is encouraged to increase her K+ to TID for 2 weeks and then return to BID dosing.    New Kdur Rx is printed for the patient.  I personally reviewed and went over radiographic studies with the patient.  The results are noted within this dictation.  Mammogram on 12/09/2015 was BIRADS 1.  She will be due for her next screening mammogram in October 2018.  She follows with Dr. Dorris Fetch for her diabetes management.  She sees Dr. Glo Herring for her Gyn needs.  She has established herself with a primary care provider which is long overdue.  She sees Dr. Nevada Crane and colleagues.    Return in 1 year for follow-up.

## 2016-01-14 ENCOUNTER — Other Ambulatory Visit (HOSPITAL_COMMUNITY): Payer: Self-pay

## 2016-01-14 ENCOUNTER — Encounter (HOSPITAL_COMMUNITY): Payer: Self-pay | Admitting: Lab

## 2016-01-14 DIAGNOSIS — C180 Malignant neoplasm of cecum: Secondary | ICD-10-CM

## 2016-01-14 DIAGNOSIS — Z95828 Presence of other vascular implants and grafts: Secondary | ICD-10-CM

## 2016-01-14 NOTE — Progress Notes (Unsigned)
Referral sent to Dr Arnoldo Morale for port removal.  They will contact patient with appt.  Records faxed on 11/21

## 2016-01-20 ENCOUNTER — Other Ambulatory Visit: Payer: Self-pay | Admitting: "Endocrinology

## 2016-01-23 ENCOUNTER — Other Ambulatory Visit: Payer: Self-pay | Admitting: "Endocrinology

## 2016-01-29 DIAGNOSIS — E1165 Type 2 diabetes mellitus with hyperglycemia: Secondary | ICD-10-CM | POA: Diagnosis not present

## 2016-01-29 DIAGNOSIS — C189 Malignant neoplasm of colon, unspecified: Secondary | ICD-10-CM | POA: Diagnosis not present

## 2016-01-29 DIAGNOSIS — Z1159 Encounter for screening for other viral diseases: Secondary | ICD-10-CM | POA: Diagnosis not present

## 2016-01-31 DIAGNOSIS — I10 Essential (primary) hypertension: Secondary | ICD-10-CM | POA: Diagnosis not present

## 2016-01-31 DIAGNOSIS — E782 Mixed hyperlipidemia: Secondary | ICD-10-CM | POA: Diagnosis not present

## 2016-01-31 DIAGNOSIS — E1165 Type 2 diabetes mellitus with hyperglycemia: Secondary | ICD-10-CM | POA: Diagnosis not present

## 2016-01-31 DIAGNOSIS — Z0001 Encounter for general adult medical examination with abnormal findings: Secondary | ICD-10-CM | POA: Diagnosis not present

## 2016-02-06 DIAGNOSIS — Z8500 Personal history of malignant neoplasm of unspecified digestive organ: Secondary | ICD-10-CM | POA: Diagnosis not present

## 2016-02-19 ENCOUNTER — Ambulatory Visit (HOSPITAL_COMMUNITY): Payer: BLUE CROSS/BLUE SHIELD

## 2016-02-19 ENCOUNTER — Encounter (HOSPITAL_COMMUNITY)
Admission: RE | Disposition: A | Discharge status: Home / Self Care | Payer: Self-pay | Source: Ambulatory Visit | Attending: General Surgery

## 2016-02-19 ENCOUNTER — Ambulatory Visit (HOSPITAL_COMMUNITY)
Admission: RE | Admit: 2016-02-19 | Discharge: 2016-02-19 | Disposition: A | Discharge status: Home / Self Care | Payer: BLUE CROSS/BLUE SHIELD | Source: Ambulatory Visit | Attending: General Surgery | Admitting: General Surgery

## 2016-02-19 DIAGNOSIS — L918 Other hypertrophic disorders of the skin: Secondary | ICD-10-CM | POA: Insufficient documentation

## 2016-02-19 DIAGNOSIS — Z85038 Personal history of other malignant neoplasm of large intestine: Secondary | ICD-10-CM | POA: Diagnosis not present

## 2016-02-19 DIAGNOSIS — Z452 Encounter for adjustment and management of vascular access device: Secondary | ICD-10-CM | POA: Diagnosis not present

## 2016-02-19 DIAGNOSIS — Z791 Long term (current) use of non-steroidal anti-inflammatories (NSAID): Secondary | ICD-10-CM | POA: Diagnosis not present

## 2016-02-19 DIAGNOSIS — E119 Type 2 diabetes mellitus without complications: Secondary | ICD-10-CM | POA: Insufficient documentation

## 2016-02-19 DIAGNOSIS — Z9049 Acquired absence of other specified parts of digestive tract: Secondary | ICD-10-CM | POA: Diagnosis not present

## 2016-02-19 DIAGNOSIS — Z79899 Other long term (current) drug therapy: Secondary | ICD-10-CM | POA: Insufficient documentation

## 2016-02-19 DIAGNOSIS — Z95828 Presence of other vascular implants and grafts: Secondary | ICD-10-CM

## 2016-02-19 DIAGNOSIS — I10 Essential (primary) hypertension: Secondary | ICD-10-CM | POA: Diagnosis not present

## 2016-02-19 DIAGNOSIS — Z888 Allergy status to other drugs, medicaments and biological substances status: Secondary | ICD-10-CM | POA: Diagnosis not present

## 2016-02-19 DIAGNOSIS — Z7984 Long term (current) use of oral hypoglycemic drugs: Secondary | ICD-10-CM | POA: Insufficient documentation

## 2016-02-19 HISTORY — PX: PORT-A-CATH REMOVAL: SHX5289

## 2016-02-19 SURGERY — MINOR REMOVAL PORT-A-CATH
Anesthesia: LOCAL

## 2016-02-19 MED ORDER — LIDOCAINE HCL (PF) 1 % IJ SOLN
INTRAMUSCULAR | Status: DC | PRN
  Administered 2016-02-19: 8 mL

## 2016-02-19 MED ORDER — POVIDONE-IODINE 10 % EX OINT
TOPICAL_OINTMENT | CUTANEOUS | Status: AC
  Filled 2016-02-19: qty 1

## 2016-02-19 MED ORDER — LIDOCAINE HCL (PF) 1 % IJ SOLN
INTRAMUSCULAR | Status: AC
  Filled 2016-02-19: qty 30

## 2016-02-19 MED ORDER — HYDROMORPHONE HCL 2 MG PO TABS: 2.0000 mg | ORAL_TABLET | ORAL | 0 refills | Status: AC | PRN

## 2016-02-19 SURGICAL SUPPLY — 19 items
CLOTH BEACON ORANGE TIMEOUT ST (SAFETY) ×3 IMPLANT
DECANTER SPIKE VIAL GLASS SM (MISCELLANEOUS) ×3 IMPLANT
DRAPE PROXIMA HALF (DRAPES) ×3 IMPLANT
DURAPREP 6ML APPLICATOR 50/CS (WOUND CARE) ×3 IMPLANT
ELECT REM PT RETURN 9FT ADLT (ELECTROSURGICAL) ×3
ELECTRODE REM PT RTRN 9FT ADLT (ELECTROSURGICAL) ×2 IMPLANT
GLOVE BIOGEL PI IND STRL 7.0 (GLOVE) ×2 IMPLANT
GLOVE BIOGEL PI INDICATOR 7.0 (GLOVE) ×1
GLOVE SURG SS PI 7.5 STRL IVOR (GLOVE) ×3 IMPLANT
GOWN STRL REUS W/TWL LRG LVL3 (GOWN DISPOSABLE) ×3 IMPLANT
LIQUID BAND (GAUZE/BANDAGES/DRESSINGS) IMPLANT
NEEDLE HYPO 25X1 1.5 SAFETY (NEEDLE) ×3 IMPLANT
PENCIL HANDSWITCHING (ELECTRODE) ×3 IMPLANT
SPONGE GAUZE 2X2 8PLY STRL LF (GAUZE/BANDAGES/DRESSINGS) ×3 IMPLANT
SUT VIC AB 3-0 SH 27 (SUTURE) ×1
SUT VIC AB 3-0 SH 27X BRD (SUTURE) ×2 IMPLANT
SUT VIC AB 4-0 PS2 27 (SUTURE) ×3 IMPLANT
SYR CONTROL 10ML LL (SYRINGE) ×3 IMPLANT
TOWEL OR 17X26 4PK STRL BLUE (TOWEL DISPOSABLE) ×3 IMPLANT

## 2016-02-19 NOTE — Discharge Instructions (Signed)
Sutured Wound Care Sutures are stitches that can be used to close wounds. Taking care of your wound properly can help prevent pain and infection. It can also help your wound to heal more quickly. How is this treated? Wound Care  Keep the wound clean and dry.  If you were given a bandage (dressing), change it at least one time per day or as told by your doctor. You should also change it if it gets wet or dirty.  Keep the wound completely dry for the first 24 hours or as told by your doctor. After that time, you may shower or bathe. However, make sure that the wound is not soaked in water until the sutures have been removed.  Clean the wound one time each day or as told by your doctor. ? Wash the wound with soap and water. ? Rinse the wound with water to remove all soap. ? Pat the wound dry with a clean towel. Do not rub the wound.  After cleaning the wound, put a thin layer of antibiotic ointment on it as told your doctor. This ointment: ? Helps to prevent infection. ? Keeps the bandage from sticking to the wound.  Have the sutures removed as told by your doctor. General Instructions  Take or apply medicines only as told by your doctor.  To help prevent scarring, make sure to cover your wound with sunscreen whenever you are outside after the sutures are removed and the wound is healed. Make sure to wear a sunscreen of at least 30 SPF.  If you were prescribed an antibiotic medicine or ointment, finish all of it even if you start to feel better.  Do not scratch or pick at the wound.  Keep all follow-up visits as told by your doctor. This is important.  Check your wound every day for signs of infection. Watch for: ? Redness, swelling, or pain. ? Fluid, blood, or pus.  Raise (elevate) the injured area above the level of your heart while you are sitting or lying down, if possible.  Avoid stretching your wound.  Drink enough fluids to keep your pee (urine) clear or pale  yellow. Contact a doctor if:  You were given a tetanus shot and you have any of these where the needle went in: ? Swelling. ? Very bad pain. ? Redness. ? Bleeding.  You have a fever.  A wound that was closed breaks open.  You notice a bad smell coming from the wound.  You notice something coming out of the wound, such as wood or glass.  Medicine does not help your pain.  You have any of these at the site of the wound. ? More redness. ? More swelling. ? More pain.  You have any of these coming from the wound. ? Fluid. ? Blood. ? Pus.  You notice a change in the color of your skin near the wound.  You need to change the bandage often due to fluid, blood, or pus coming from the wound.  You have a new rash.  You have numbness around the wound. Get help right away if:  You have very bad swelling around the wound.  Your pain suddenly gets worse and is very bad.  You have painful lumps near the wound or on skin that is anywhere on your body.  You have a red streak going away from the wound.  The wound is on your hand or foot and you cannot move a finger or toe like normal.  The   wound is on your hand or foot and you notice that your fingers or toes look pale or bluish. This information is not intended to replace advice given to you by your health care provider. Make sure you discuss any questions you have with your health care provider. Document Released: 07/29/2007 Document Revised: 07/18/2015 Document Reviewed: 09/21/2012 Elsevier Interactive Patient Education  2017 [place] to the ER at [facility] if you have any significant chest pain.  May shower tomorrow.

## 2016-02-19 NOTE — Interval H&P Note (Signed)
History and Physical Interval Note:  02/19/2016 10:57 AM  Vanessa [name]  has presented today for surgery, with the diagnosis of h/o colon cancer, skin tags  The various methods of treatment have been discussed with the patient and family. After consideration of risks, benefits and other options for treatment, the patient has consented to  Procedure(s): MINOR REMOVAL PORT-A-CATH (Left) MINOR EXCISION OF SKIN TAG'S ON NECK (N/A) as a surgical intervention .  The patient's history has been reviewed, patient examined, no change in status, stable for surgery.  I have reviewed the patient's chart and labs.  Questions were answered to the patient's satisfaction.     Aviva Signs A

## 2016-02-19 NOTE — H&P (Signed)
  NTS SOAP Note  Vital Signs:  Vitals as of: 123XX123: Systolic 123456: Diastolic 71: Heart Rate 96: Temp 97.82F (Temporal): Height 78ft 2in: Weight 146Lbs 0 Ounces: BMI 26.7   BMI : 26.7 kg/m2  Subjective: This 60 year old female presents for of removal of portacath.  Has been in place since 2005 for colon cancer.  Referred by Dr. Nevada Crane.  Also has skin tags on neck which she would like removed.  Review of Symptoms:  Constitutional:negative Head:negative Eyes:pain bilateral sore throat Cardiovascular:negative Respiratory:negative Gastrointestinnegative Genitourinary:negative Musculoskeletal:negative skin tags on neck Hematolgic/Lymphatic:negative Allergic/Immunologic:negative   Past Medical History:Reviewed  Past Medical History  Surgical History: partial colectomy/portacath insertion 2005 Medical Problems: DM, remote h/o colon cancer Allergies: tylox Medications: metformin, kcl, glipizide, naproxen, lisinopril/HCTZ, amlodipine   Social History:Reviewed  Social History  Preferred Language: English Race:  Black or African American Ethnicity: Not Hispanic / Latino Age: 60 year Marital Status:  M Alcohol: no   Smoking Status: Never smoker reviewed on 02/06/2016 Functional Status reviewed on 02/06/2016 ------------------------------------------------ Bathing: Normal Cooking: Normal Dressing: Normal Driving: Normal Eating: Normal Managing Meds: Normal Oral Care: Normal Shopping: Normal Toileting: Normal Transferring: Normal Walking: Normal Cognitive Status reviewed on 02/06/2016 ------------------------------------------------ Attention: Normal Decision Making: Normal Language: Normal Memory: Normal Motor: Normal Perception: Normal Problem Solving: Normal Visual and Spatial: Normal   Family History:Reviewed  Family Health History Mother, Deceased; Cancer unspecified;  Father, Deceased; History Unknown    Objective  Information: General:Well appearing, well nourished in no distress. multiple skin tags on neck,  Port in place left upper chest Neck:Supple without lymphadenopathy.  Heart:RRR, no murmur or gallop.  Normal S1, S2.  No S3, S4.  Lungs:CTA bilaterally, no wheezes, rhonchi, rales.  Breathing unlabored. Dr. Juel Burrow notes reviewed. Assessment:h/o colon cancer, skin tags  Diagnoses: V10.00  Z85.00 History of malignant neoplasm of gastrointestinal tract (Personal history of malignant neoplasm of unspecified digestive organ)  Procedures: [id number] - OFFICE OUTPATIENT NEW 20 MINUTES    Plan:  Patient will call to schedule portacath removal, skin tag removal.   Patient Education:Alternative treatments to surgery were discussed with patient (and family).Risks and benefits  of procedure were fully explained to the patient (and family) who gave informed consent. Patient/family questions were addressed.  Follow-up:Pending Surgery

## 2016-02-19 NOTE — Op Note (Signed)
Patient:  Vanessa [name]  DOB:  05/10/1955  MRN:  [id number]   Preop Diagnosis:  History of colon cancer  Postop Diagnosis:  Same  Procedure:  Partial removal of Port-A-Cath  Surgeon:  Mark Jenkins, M.D.  Anes:  Local  Indications:  Patient is a 60-year-old black female who presents for a Port-A-Cath removal. It was placed in 2005 for colon cancer. She also desires excision of skin tags on her neck. The risks and benefits of both procedures including bleeding and infection were fully explained to the patient, who gave informed consent. on her neck.  Procedure note:  The patient was placed the supine position. The left upper chest was prepped and draped using usual sterile technique with DuraPrep. Surgical site confirmation was performed. 1% Xylocaine was used for local anesthesia.  An incision was made to the previous surgical scar. This was taken down to the port. The port was located without difficulty. While trying to extract the catheter, I met significant resistance. Despite several attempts, I could not remove the catheter portion of the port. I did divide the catheter close to the port in order to remove the port, and placed multiple medium size clips on the remaining catheter. The wound was irrigated. The skin was closed using 4-0 Prolene interrupted sutures. Betadine ointment and dry sterile dressings were applied.  All tape and needle counts were correct at the end of the procedure. A chest x-ray was performed which revealed the catheter tip to be within the superior vena cava. The was no evidence of mediastinal widening. I did discuss the case with Dr. Cain of Vascular surgery who agreed with possibly leaving the catheter in place. His office will call the patient for follow-up. The patient and daughter were informed about the remaining catheter in place in the risks involved in trying to remove it.  She was discharged from the minor procedure room in good  condition.  Complications:  Inability to fully remove catheter  EBL:  Minimal  Specimen:  None     

## 2016-02-21 ENCOUNTER — Encounter (HOSPITAL_COMMUNITY): Payer: Self-pay | Admitting: General Surgery

## 2016-02-21 ENCOUNTER — Telehealth: Payer: Self-pay | Admitting: Vascular Surgery

## 2016-02-21 NOTE — Telephone Encounter (Signed)
Sched appt 02/28/16 at 1:00. Spoke to pt to inform them of appt.

## 2016-02-21 NOTE — Telephone Encounter (Signed)
-----   Message from Denman George, RN sent at 02/19/2016 12:20 PM EST ----- Regarding: needs appt. with Dr. Donzetta Matters Please schedule appt. with Dr. Donzetta Matters within next 2 weeks for eval. for retained portion of Port-A-Cath; referred by Dr. Aviva Signs @ [facility]; s/p Partial Removal of Port-A-Cath on 02/19/16

## 2016-02-28 ENCOUNTER — Ambulatory Visit (INDEPENDENT_AMBULATORY_CARE_PROVIDER_SITE_OTHER): Payer: BLUE CROSS/BLUE SHIELD | Admitting: Vascular Surgery

## 2016-02-28 ENCOUNTER — Encounter: Payer: Self-pay | Admitting: Vascular Surgery

## 2016-02-28 VITALS — BP 128/79 | HR 98 | Temp 98.8°F | Resp 16 | Ht 64.0 in | Wt 146.0 lb

## 2016-02-28 DIAGNOSIS — Z95828 Presence of other vascular implants and grafts: Secondary | ICD-10-CM | POA: Diagnosis not present

## 2016-02-28 NOTE — Progress Notes (Signed)
Patient ID: Vanessa [name], female   DOB: [birth year], 61 y.o.   MRN: [id number]  Reason for Consult: New Evaluation (retained portion of porta cath)   Referred by Aviva Signs, MD  Subjective:     HPI:  Vanessa [name] is a 61 y.o. female with history of colon cancer at which time she had a port placed 12 years ago. She is not in any issues with the port recently underwent attempted removal by Dr. Arnoldo Morale. At that time he removed the reservoir of the port could not remove the catheter into the central venous system. This time he called me stating he had clipped it and was closing the patient's skin. We discussed having her follow-up with me which she now does. She denies any issues in the past for the port her wound is healing well she is not had fevers or chills. She does not have any swelling of her hands or face that is significant. She is otherwise doing well without complaints.  Past Medical History:  Diagnosis Date  . Abdominal pain, other specified site   . Adenocarcinoma of cecum ([place]) 01/15/2014  . Anemia   . Arthritis   . Colon cancer ([place])   . Fibroids   . Gall bladder disease    inflammation  . Hypertension   . Peptic ulcer   . Yeast infection involving the vagina and surrounding area    Family History  Problem Relation Age of Onset  . Cancer Mother     lung cancer  . Heart attack Brother   . Kidney disease Sister   . Thyroid nodules Daughter   . Fibroids Sister    Past Surgical History:  Procedure Laterality Date  . bilateral nipple resections    . COLON SURGERY    . COLONOSCOPY    . COLONOSCOPY N/A 11/29/2013   Procedure: COLONOSCOPY;  Surgeon: Rogene Houston, MD;  [facility];  Service: Endoscopy;  Laterality: N/A;  1030  . egd/tcs    . PORT-A-CATH REMOVAL Left 02/19/2016   Procedure: MINOR REMOVAL PORT-A-CATH;  Surgeon: Aviva Signs, MD;  Location: [facility];  Service: General;  Laterality: Left;  . prot-a-cath placement      Short  Social History:  Social History  Substance Use Topics  . Smoking status: Never Smoker  . Smokeless tobacco: Never Used  . Alcohol use No    Allergies  Allergen Reactions  . Oxycodone-Acetaminophen     "burning up"     Current Outpatient Prescriptions  Medication Sig Dispense Refill  . amLODipine (NORVASC) 10 MG tablet Take 1 tablet (10 mg total) by mouth daily. 30 tablet 5  . amLODipine (NORVASC) 10 MG tablet Take 1 tablet (10 mg total) by mouth daily. 30 tablet 6  . aspirin 81 MG tablet Take 81 mg by mouth daily.    . Calcium Carbonate-Vitamin D (CALCIUM + D PO) Take by mouth daily.      . cyclobenzaprine (FLEXERIL) 10 MG tablet Take 10 mg by mouth 3 (three) times daily as needed for muscle spasms.    . fish oil-omega-3 fatty acids 1000 MG capsule Take 1 g by mouth daily.      . fluconazole (DIFLUCAN) 100 MG tablet Take 1 tablet (100 mg total) by mouth daily. 14 tablet 0  . fluticasone (FLONASE) 50 MCG/ACT nasal spray Place 2 sprays into the nose as needed for allergies.     Marland Kitchen lisinopril-hydrochlorothiazide (PRINZIDE,ZESTORETIC) 10-12.5 MG tablet Take 1 tablet by mouth  daily.  0  . lisinopril-hydrochlorothiazide (PRINZIDE,ZESTORETIC) 20-12.5 MG tablet Take 2 tablets by mouth daily. Restart after follow up with primary care doctor 60 tablet 6  . metFORMIN (GLUCOPHAGE) 500 MG tablet Take 1 tablet (500 mg total) by mouth 2 (two) times daily with a meal. 60 tablet 3  . Multiple Vitamins-Minerals (CENTRUM SILVER PO) Take by mouth daily.      . naproxen (NAPROSYN) 500 MG tablet Take 500 mg by mouth 2 (two) times daily with a meal.    . NAPROXEN DR 500 MG EC tablet   0  . nystatin (MYCOSTATIN) 100000 UNIT/ML suspension Take 5 mLs (500,000 Units total) by mouth 4 (four) times daily. 60 mL 0  . potassium chloride (KLOR-CON M10) 10 MEQ tablet Take 1 tablet (10 mEq total) by mouth 2 (two) times daily. 60 tablet 6  . Simethicone (GAS RELIEF EXTRA STRENGTH PO) Take 1-2 tablets by mouth daily as  needed (gas relief).    . TURMERIC PO Take by mouth daily.    Marland Kitchen glipiZIDE (GLUCOTROL) 10 MG tablet Take 10 mg by mouth 2 (two) times daily.    Marland Kitchen HYDROmorphone (DILAUDID) 2 MG tablet Take 1 tablet (2 mg total) by mouth every 4 (four) hours as needed for severe pain. (Patient not taking: Reported on 02/28/2016) 30 tablet 0  . JARDIANCE 25 MG TABS tablet   0  . traMADol (ULTRAM) 50 MG tablet Take 1 tablet (50 mg total) by mouth every 6 (six) hours as needed. (Patient not taking: Reported on 02/28/2016) 60 tablet 0   No current facility-administered medications for this visit.     Review of Systems  Constitutional:  Constitutional negative. Eyes: Eyes negative.  Respiratory: Respiratory negative.  Cardiovascular: Cardiovascular negative.  GI: Gastrointestinal negative.  Musculoskeletal:       Left sided pain inferior to breast Skin:       Healing port site wound Neurological: Neurological negative. Hematologic: Hematologic/lymphatic negative.  Psychiatric: Psychiatric negative.        Objective:  Objective   Vitals:   02/28/16 1313  BP: 128/79  Pulse: 98  Resp: 16  Temp: 98.8 F (37.1 C)  SpO2: 99%  Weight: 146 lb (66.2 kg)  Height: 5\' 4"  (1.626 m)   Body mass index is 25.06 kg/m.  Physical Exam  Constitutional: She is oriented to person, place, and time. She appears well-developed.  HENT:  Head: Atraumatic.  Eyes: EOM are normal.  Neck: Neck supple. No thyromegaly present.  Cardiovascular: Normal rate.   Pulses:      Radial pulses are 2+ on the right side, and 2+ on the left side.  Pulmonary/Chest: Effort normal.  Abdominal: Soft.  Musculoskeletal: Normal range of motion.  Lymphadenopathy:    She has no cervical adenopathy.  Neurological: She is alert and oriented to person, place, and time.  Skin: Skin is warm and dry.  Well healing incision with sutures in place  Psychiatric: She has a normal mood and affect. Her behavior is normal. Judgment and thought content  normal.    Data: PORTABLE CHEST 1 VIEW  COMPARISON:  Portable chest x-ray of 02/09/2015  FINDINGS: No active infiltrate or effusion is seen. Mediastinal and hilar contours are unremarkable. The heart is within upper limits of normal. No bony abnormality is seen.  IMPRESSION: No active disease.     Assessment/Plan:     61 year old female presents for evaluation of retained port catheter following attempted for removal. She is asymptomatic from this. I discussed with  her the extent of procedure in my require to remove this tubing given that she is a symptomatically at this time I do not think it is worth it. She is certainly in agreement. I offered her a second opinion and she does not want to seek one. Should see of issues with this port to be in the future we will be happy to see her to discuss possible options of removal.     Waynetta Sandy MD [facility]

## 2016-07-29 DIAGNOSIS — E1165 Type 2 diabetes mellitus with hyperglycemia: Secondary | ICD-10-CM | POA: Diagnosis not present

## 2016-07-31 DIAGNOSIS — I10 Essential (primary) hypertension: Secondary | ICD-10-CM | POA: Diagnosis not present

## 2016-07-31 DIAGNOSIS — C189 Malignant neoplasm of colon, unspecified: Secondary | ICD-10-CM | POA: Diagnosis not present

## 2016-07-31 DIAGNOSIS — E782 Mixed hyperlipidemia: Secondary | ICD-10-CM | POA: Diagnosis not present

## 2016-07-31 DIAGNOSIS — E1165 Type 2 diabetes mellitus with hyperglycemia: Secondary | ICD-10-CM | POA: Diagnosis not present

## 2016-11-24 ENCOUNTER — Other Ambulatory Visit (HOSPITAL_COMMUNITY): Payer: Self-pay | Admitting: Internal Medicine

## 2016-11-24 DIAGNOSIS — Z1231 Encounter for screening mammogram for malignant neoplasm of breast: Secondary | ICD-10-CM

## 2016-12-07 ENCOUNTER — Ambulatory Visit (HOSPITAL_COMMUNITY)
Admission: RE | Admit: 2016-12-07 | Discharge: 2016-12-07 | Disposition: A | Discharge status: Home / Self Care | Payer: BLUE CROSS/BLUE SHIELD | Source: Ambulatory Visit | Attending: Internal Medicine | Admitting: Internal Medicine

## 2016-12-07 DIAGNOSIS — Z1231 Encounter for screening mammogram for malignant neoplasm of breast: Secondary | ICD-10-CM

## 2016-12-25 ENCOUNTER — Other Ambulatory Visit (HOSPITAL_COMMUNITY): Payer: Self-pay

## 2016-12-25 ENCOUNTER — Ambulatory Visit (HOSPITAL_COMMUNITY): Payer: Self-pay

## 2017-01-27 DIAGNOSIS — C189 Malignant neoplasm of colon, unspecified: Secondary | ICD-10-CM | POA: Diagnosis not present

## 2017-01-27 DIAGNOSIS — E782 Mixed hyperlipidemia: Secondary | ICD-10-CM | POA: Diagnosis not present

## 2017-01-27 DIAGNOSIS — I10 Essential (primary) hypertension: Secondary | ICD-10-CM | POA: Diagnosis not present

## 2017-01-27 DIAGNOSIS — E1165 Type 2 diabetes mellitus with hyperglycemia: Secondary | ICD-10-CM | POA: Diagnosis not present

## 2017-02-10 DIAGNOSIS — Z0001 Encounter for general adult medical examination with abnormal findings: Secondary | ICD-10-CM | POA: Diagnosis not present

## 2017-02-10 DIAGNOSIS — C189 Malignant neoplasm of colon, unspecified: Secondary | ICD-10-CM | POA: Diagnosis not present

## 2017-02-10 DIAGNOSIS — E782 Mixed hyperlipidemia: Secondary | ICD-10-CM | POA: Diagnosis not present

## 2017-02-10 DIAGNOSIS — I10 Essential (primary) hypertension: Secondary | ICD-10-CM | POA: Diagnosis not present

## 2017-02-10 DIAGNOSIS — E1165 Type 2 diabetes mellitus with hyperglycemia: Secondary | ICD-10-CM | POA: Diagnosis not present

## 2017-06-07 IMAGING — CR DG CHEST 1V PORT
1 series · 1 of 1 positions shown · non-contrast
Comparison: Portable chest x-ray of 02/09/2015

ADDENDUM:
After discussing this case with Dr. Hajir, the port from the
left-sided Port-A-Cath has been removed. Dr. Hajir described that
he could not remove the catheter itself which may be due to
surrounding fibrosis. The tip of the catheter overlies the expected
mid SVC.
CLINICAL DATA: Port-A-Cath placement, hypertension

EXAM:
PORTABLE CHEST 1 VIEW

[ap portable]
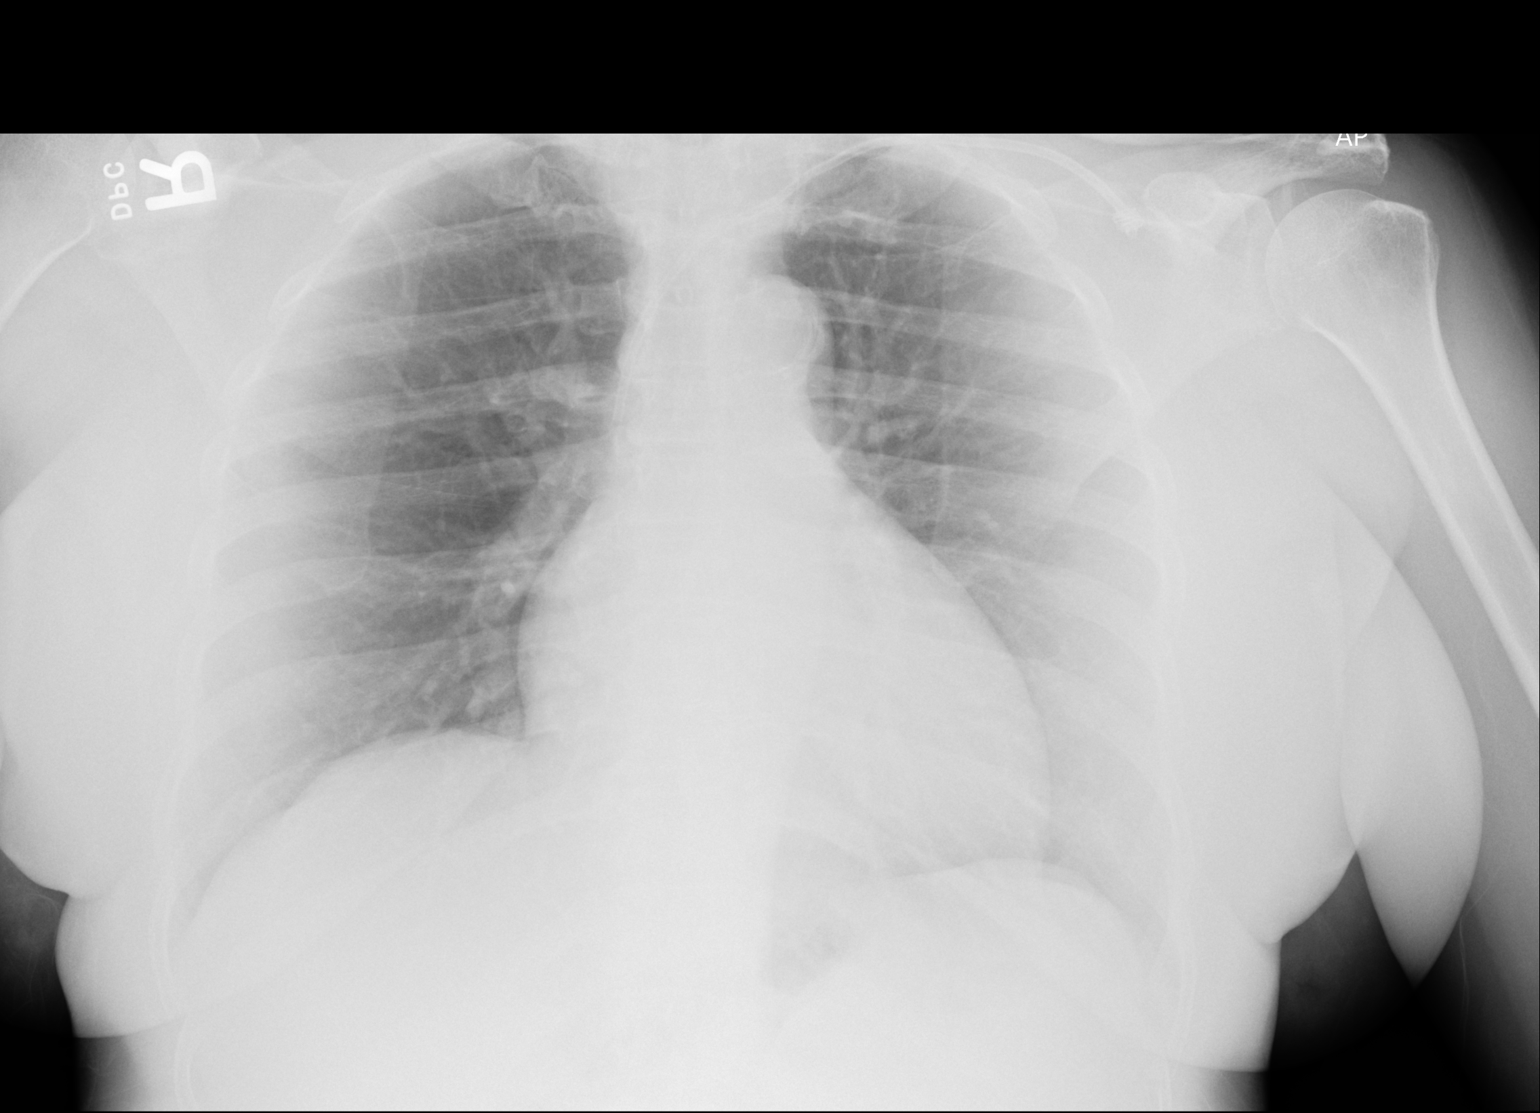

[1 of 1 positions shown; findings below may reference images not displayed]

FINDINGS: No active infiltrate or effusion is seen. Mediastinal and hilar
contours are unremarkable. The heart is within upper limits of
normal. No bony abnormality is seen.
IMPRESSION: No active disease.

## 2017-08-06 DIAGNOSIS — I10 Essential (primary) hypertension: Secondary | ICD-10-CM | POA: Diagnosis not present

## 2017-08-06 DIAGNOSIS — C189 Malignant neoplasm of colon, unspecified: Secondary | ICD-10-CM | POA: Diagnosis not present

## 2017-08-06 DIAGNOSIS — E1165 Type 2 diabetes mellitus with hyperglycemia: Secondary | ICD-10-CM | POA: Diagnosis not present

## 2017-08-06 DIAGNOSIS — E782 Mixed hyperlipidemia: Secondary | ICD-10-CM | POA: Diagnosis not present

## 2017-08-25 DIAGNOSIS — I10 Essential (primary) hypertension: Secondary | ICD-10-CM | POA: Diagnosis not present

## 2017-08-25 DIAGNOSIS — E782 Mixed hyperlipidemia: Secondary | ICD-10-CM | POA: Diagnosis not present

## 2017-08-25 DIAGNOSIS — Z0000 Encounter for general adult medical examination without abnormal findings: Secondary | ICD-10-CM | POA: Diagnosis not present

## 2017-08-25 DIAGNOSIS — E1165 Type 2 diabetes mellitus with hyperglycemia: Secondary | ICD-10-CM | POA: Diagnosis not present

## 2017-11-16 ENCOUNTER — Encounter (INDEPENDENT_AMBULATORY_CARE_PROVIDER_SITE_OTHER): Payer: Self-pay | Admitting: *Deleted

## 2017-12-02 ENCOUNTER — Other Ambulatory Visit (HOSPITAL_COMMUNITY): Payer: Self-pay | Admitting: Internal Medicine

## 2017-12-02 DIAGNOSIS — Z1231 Encounter for screening mammogram for malignant neoplasm of breast: Secondary | ICD-10-CM

## 2017-12-08 ENCOUNTER — Ambulatory Visit (HOSPITAL_COMMUNITY)
Admission: RE | Admit: 2017-12-08 | Discharge: 2017-12-08 | Disposition: A | Discharge status: Home / Self Care | Payer: BLUE CROSS/BLUE SHIELD | Source: Ambulatory Visit | Attending: Internal Medicine | Admitting: Internal Medicine

## 2017-12-08 DIAGNOSIS — Z1231 Encounter for screening mammogram for malignant neoplasm of breast: Secondary | ICD-10-CM | POA: Diagnosis not present

## 2017-12-09 ENCOUNTER — Other Ambulatory Visit (INDEPENDENT_AMBULATORY_CARE_PROVIDER_SITE_OTHER): Payer: Self-pay | Admitting: *Deleted

## 2017-12-09 DIAGNOSIS — Z85038 Personal history of other malignant neoplasm of large intestine: Secondary | ICD-10-CM

## 2017-12-09 DIAGNOSIS — Z86010 Personal history of colonic polyps: Secondary | ICD-10-CM | POA: Insufficient documentation

## 2017-12-13 ENCOUNTER — Telehealth: Payer: Self-pay | Admitting: *Deleted

## 2017-12-13 NOTE — Telephone Encounter (Signed)
Attempted to call pt to notify of normal mammogram. No answer

## 2017-12-28 ENCOUNTER — Telehealth (INDEPENDENT_AMBULATORY_CARE_PROVIDER_SITE_OTHER): Payer: Self-pay | Admitting: *Deleted

## 2017-12-28 ENCOUNTER — Encounter (INDEPENDENT_AMBULATORY_CARE_PROVIDER_SITE_OTHER): Payer: Self-pay | Admitting: *Deleted

## 2017-12-28 NOTE — Telephone Encounter (Signed)
Referring MD/PCP: hall   Procedure: tcs  Reason/Indication:  Hx colon ca, hx polyps  Has patient had this procedure before?  Yes, 2015  If so, when, by whom and where?    Is there a family history of colon cancer?  no  Who?  What age when diagnosed?    Is patient diabetic?   yes      Does patient have prosthetic heart valve or mechanical valve?  no  Do you have a pacemaker?  no  Has patient ever had endocarditis? no  Has patient had joint replacement within last 12 months?  no  Is patient constipated or do they take laxatives? no  Does patient have a history of alcohol/drug use?  no  Is patient on blood thinner such as Coumadin, Plavix and/or Aspirin? no  Medications: lisinopril/hctz 20/12.5 mg daily, furosemide 20 mg daily, amlodipine 10 mg daily, metformin 500 mg bid, potassium bid, centrum silver, aleve, red yeast rice, garlique  Allergies: see epic  Medication Adjustment per Dr Lindi Adie, NP: hold diabetic meds evening before & morning of  Procedure date & time: 01/27/18 at 1200

## 2017-12-29 NOTE — Telephone Encounter (Signed)
agree

## 2018-01-26 DIAGNOSIS — E782 Mixed hyperlipidemia: Secondary | ICD-10-CM | POA: Diagnosis not present

## 2018-01-26 DIAGNOSIS — E1165 Type 2 diabetes mellitus with hyperglycemia: Secondary | ICD-10-CM | POA: Diagnosis not present

## 2018-01-26 DIAGNOSIS — C189 Malignant neoplasm of colon, unspecified: Secondary | ICD-10-CM | POA: Diagnosis not present

## 2018-01-26 DIAGNOSIS — I10 Essential (primary) hypertension: Secondary | ICD-10-CM | POA: Diagnosis not present

## 2018-01-27 ENCOUNTER — Ambulatory Visit (HOSPITAL_COMMUNITY)
Admission: RE | Admit: 2018-01-27 | Discharge: 2018-01-27 | Disposition: A | Discharge status: Home / Self Care | Payer: BLUE CROSS/BLUE SHIELD | Source: Ambulatory Visit | Attending: Internal Medicine | Admitting: Internal Medicine

## 2018-01-27 ENCOUNTER — Encounter (HOSPITAL_COMMUNITY): Payer: Self-pay | Admitting: *Deleted

## 2018-01-27 ENCOUNTER — Other Ambulatory Visit: Payer: Self-pay

## 2018-01-27 ENCOUNTER — Encounter (HOSPITAL_COMMUNITY)
Admission: RE | Disposition: A | Discharge status: Home / Self Care | Payer: Self-pay | Source: Ambulatory Visit | Attending: Internal Medicine

## 2018-01-27 DIAGNOSIS — Z09 Encounter for follow-up examination after completed treatment for conditions other than malignant neoplasm: Secondary | ICD-10-CM | POA: Insufficient documentation

## 2018-01-27 DIAGNOSIS — Z980 Intestinal bypass and anastomosis status: Secondary | ICD-10-CM | POA: Diagnosis not present

## 2018-01-27 DIAGNOSIS — Z791 Long term (current) use of non-steroidal anti-inflammatories (NSAID): Secondary | ICD-10-CM | POA: Insufficient documentation

## 2018-01-27 DIAGNOSIS — I10 Essential (primary) hypertension: Secondary | ICD-10-CM | POA: Diagnosis not present

## 2018-01-27 DIAGNOSIS — Z860100 Personal history of colon polyps, unspecified: Secondary | ICD-10-CM | POA: Insufficient documentation

## 2018-01-27 DIAGNOSIS — Z7982 Long term (current) use of aspirin: Secondary | ICD-10-CM | POA: Insufficient documentation

## 2018-01-27 DIAGNOSIS — Z85038 Personal history of other malignant neoplasm of large intestine: Secondary | ICD-10-CM | POA: Diagnosis not present

## 2018-01-27 DIAGNOSIS — Z9049 Acquired absence of other specified parts of digestive tract: Secondary | ICD-10-CM | POA: Diagnosis not present

## 2018-01-27 DIAGNOSIS — Z7984 Long term (current) use of oral hypoglycemic drugs: Secondary | ICD-10-CM | POA: Diagnosis not present

## 2018-01-27 DIAGNOSIS — Z79899 Other long term (current) drug therapy: Secondary | ICD-10-CM | POA: Insufficient documentation

## 2018-01-27 DIAGNOSIS — Z08 Encounter for follow-up examination after completed treatment for malignant neoplasm: Secondary | ICD-10-CM | POA: Diagnosis not present

## 2018-01-27 HISTORY — PX: COLONOSCOPY: SHX5424

## 2018-01-27 LAB — GLUCOSE, CAPILLARY: GLUCOSE-CAPILLARY: 118 mg/dL — AB (ref 70–99)

## 2018-01-27 SURGERY — COLONOSCOPY
Anesthesia: Moderate Sedation

## 2018-01-27 MED ORDER — STERILE WATER FOR IRRIGATION IR SOLN
Status: DC | PRN
  Administered 2018-01-27: 1.5 mL

## 2018-01-27 MED ORDER — SODIUM CHLORIDE 0.9 % IV SOLN
INTRAVENOUS | Status: DC
  Administered 2018-01-27: 11:00:00 via INTRAVENOUS

## 2018-01-27 MED ORDER — MIDAZOLAM HCL 5 MG/5ML IJ SOLN
INTRAMUSCULAR | Status: AC
  Filled 2018-01-27: qty 10

## 2018-01-27 MED ORDER — MEPERIDINE HCL 50 MG/ML IJ SOLN
INTRAMUSCULAR | Status: AC
  Filled 2018-01-27: qty 1

## 2018-01-27 MED ORDER — MIDAZOLAM HCL 5 MG/5ML IJ SOLN
INTRAMUSCULAR | Status: DC | PRN
  Administered 2018-01-27 (×3): 2 mg via INTRAVENOUS

## 2018-01-27 MED ORDER — MEPERIDINE HCL 50 MG/ML IJ SOLN
INTRAMUSCULAR | Status: DC | PRN
  Administered 2018-01-27 (×2): 25 mg via INTRAVENOUS

## 2018-01-27 NOTE — Discharge Instructions (Signed)
Colonoscopy, Adult, Care After This sheet gives you information about how to care for yourself after your procedure. Your doctor may also give you more specific instructions. If you have problems or questions, call your doctor. Follow these instructions at home: General instructions   For the first 24 hours after the procedure: ? Do not drive or use machinery. ? Do not sign important documents. ? Do not drink alcohol. ? Do your daily activities more slowly than normal. ? Eat foods that are soft and easy to digest. ? Rest often.  Take over-the-counter or prescription medicines only as told by your doctor.  It is up to you to get the results of your procedure. Ask your doctor, or the department performing the procedure, when your results will be ready. To help cramping and bloating:  Try walking around.  Put heat on your belly (abdomen) as told by your doctor. Use a heat source that your doctor recommends, such as a moist heat pack or a heating pad. ? Put a towel between your skin and the heat source. ? Leave the heat on for 20-30 minutes. ? Remove the heat if your skin turns bright red. This is especially important if you cannot feel pain, heat, or cold. You can get burned. Eating and drinking  Drink enough fluid to keep your pee (urine) clear or pale yellow.  Return to your normal diet as told by your doctor. Avoid heavy or fried foods that are hard to digest.  Avoid drinking alcohol for as long as told by your doctor. Contact a doctor if:  You have blood in your poop (stool) 2-3 days after the procedure. Get help right away if:  You have more than a small amount of blood in your poop.  You see large clumps of tissue (blood clots) in your poop.  Your belly is swollen.  You feel sick to your stomach (nauseous).  You throw up (vomit).  You have a fever.  You have belly pain that gets worse, and medicine does not help your pain. This information is not intended to  replace advice given to you by your health care provider. Make sure you discuss any questions you have with your health care provider. Document Released: 03/14/2010 Document Revised: 11/04/2015 Document Reviewed: 11/04/2015 Elsevier Interactive Patient Education  2017 [place] usual medications as before. Naprosyn/Aleve on as-needed basis if possible. Resume usual diet. No driving for 24 hours. Next colonoscopy in 5 years.

## 2018-01-27 NOTE — H&P (Signed)
Vanessa [name] is an 62 y.o. female.   Chief Complaint: Patient is here for colonoscopy. HPI: Patient is 62 year old African-American female with history of colon carcinoma who is here for surveillance colonoscopy.  She also has had a polyp removed along the way.  She denies abdominal pain change in bowel habits or rectal bleeding.  She may have occasional diarrhea.  She feels it may be related to metformin.  She has good appetite and her weight is stable. She underwent right hemicolectomy in August 2005 followed by chemotherapy.  She has remained in remission.  Last colonoscopy was in October 2015. Family history is negative for CRC.  Past Medical History:  Diagnosis Date  . Abdominal pain, other specified site   . Adenocarcinoma of cecum ([place]) 01/15/2014  . Anemia   . Arthritis   . Colon cancer ([place])   . Fibroids   . Gall bladder disease    inflammation  . Hypertension   . Peptic ulcer   . Yeast infection involving the vagina and surrounding area     Past Surgical History:  Procedure Laterality Date  . bilateral nipple resections    . COLON SURGERY    . COLONOSCOPY    . COLONOSCOPY N/A 11/29/2013   Procedure: COLONOSCOPY;  Surgeon: Rogene Houston, MD;  [facility];  Service: Endoscopy;  Laterality: N/A;  1030  . egd/tcs    . PORT-A-CATH REMOVAL Left 02/19/2016   Procedure: MINOR REMOVAL PORT-A-CATH;  Surgeon: Aviva Signs, MD;  Location: [facility];  Service: General;  Laterality: Left;  . prot-a-cath placement      Family History  Problem Relation Age of Onset  . Cancer Mother        lung cancer  . Heart attack Brother   . Kidney disease Sister   . Thyroid nodules Daughter   . Fibroids Sister    Social History:  reports that she has never smoked. She has never used smokeless tobacco. She reports that she does not drink alcohol or use drugs.  Allergies:  Allergies  Allergen Reactions  . Oxycodone-Acetaminophen     "burning up"     Medications Prior  to Admission  Medication Sig Dispense Refill  . amLODipine (NORVASC) 10 MG tablet Take 1 tablet (10 mg total) by mouth daily. 30 tablet 5  . amLODipine (NORVASC) 10 MG tablet Take 1 tablet (10 mg total) by mouth daily. 30 tablet 6  . fish oil-omega-3 fatty acids 1000 MG capsule Take 1 g by mouth daily.      . Garlic (GARLIQUE PO) Take by mouth.    Marland Kitchen lisinopril-hydrochlorothiazide (PRINZIDE,ZESTORETIC) 10-12.5 MG tablet Take 1 tablet by mouth daily.  0  . metFORMIN (GLUCOPHAGE) 500 MG tablet Take 1 tablet (500 mg total) by mouth 2 (two) times daily with a meal. 60 tablet 3  . Multiple Vitamins-Minerals (CENTRUM SILVER PO) Take by mouth daily.      . potassium chloride (KLOR-CON M10) 10 MEQ tablet Take 1 tablet (10 mEq total) by mouth 2 (two) times daily. 60 tablet 6  . TURMERIC PO Take by mouth daily.    Marland Kitchen aspirin 81 MG tablet Take 81 mg by mouth daily.    . Calcium Carbonate-Vitamin D (CALCIUM + D PO) Take by mouth daily.      . cyclobenzaprine (FLEXERIL) 10 MG tablet Take 10 mg by mouth 3 (three) times daily as needed for muscle spasms.    . fluconazole (DIFLUCAN) 100 MG tablet Take 1 tablet (100  mg total) by mouth daily. 14 tablet 0  . fluticasone (FLONASE) 50 MCG/ACT nasal spray Place 2 sprays into the nose as needed for allergies.     Marland Kitchen glipiZIDE (GLUCOTROL) 10 MG tablet Take 10 mg by mouth 2 (two) times daily.    Marland Kitchen JARDIANCE 25 MG TABS tablet   0  . lisinopril-hydrochlorothiazide (PRINZIDE,ZESTORETIC) 20-12.5 MG tablet Take 2 tablets by mouth daily. Restart after follow up with primary care doctor 60 tablet 6  . naproxen (NAPROSYN) 500 MG tablet Take 500 mg by mouth 2 (two) times daily with a meal.    . NAPROXEN DR 500 MG EC tablet   0  . nystatin (MYCOSTATIN) 100000 UNIT/ML suspension Take 5 mLs (500,000 Units total) by mouth 4 (four) times daily. 60 mL 0  . Simethicone (GAS RELIEF EXTRA STRENGTH PO) Take 1-2 tablets by mouth daily as needed (gas relief).    . traMADol (ULTRAM) 50 MG  tablet Take 1 tablet (50 mg total) by mouth every 6 (six) hours as needed. (Patient not taking: Reported on 02/28/2016) 60 tablet 0    Results for orders placed or performed during the hospital encounter of 01/27/18 (from the past 48 hour(s))  Glucose, capillary     Status: Abnormal   Collection Time: 01/27/18 11:02 AM  Result Value Ref Range   Glucose-Capillary 118 (H) 70 - 99 mg/dL   No results found.  ROS  Blood pressure 138/80, pulse 96, temperature 98.4 F (36.9 C), temperature source Oral, resp. rate 15, height 5\' 4"  (1.626 m), weight 72.6 kg, SpO2 98 %. Physical Exam  Constitutional: She appears well-developed and well-nourished.  HENT:  Mouth/Throat: Oropharynx is clear and moist.  Bony exostosis over hard palate  Eyes: Conjunctivae are normal. No scleral icterus.  Neck: No thyromegaly present.  Cardiovascular: Normal rate, regular rhythm and normal heart sounds.  No murmur heard. Respiratory: Effort normal and breath sounds normal.  GI:  Abdomen is symmetrical with low midline scar.  On palpation is soft and nontender with organomegaly or masses.  Musculoskeletal: She exhibits no edema.  Lymphadenopathy:    She has no cervical adenopathy.  Neurological: She is alert.  Skin: Skin is warm and dry.     Assessment/Plan History of colon carcinoma. Surveillance colonoscopy.  Hildred Laser, MD 01/27/2018, 11:28 AM

## 2018-01-27 NOTE — Op Note (Signed)
[facility] Patient Name: Vanessa [name] Procedure Date: 01/27/2018 11:29 AM MRN: [id number] Date of Birth: Dec 11, 1955 Attending MD: Hildred Laser , MD CSN: [id number] Age: 62 Admit Type: Outpatient Procedure:                Colonoscopy Indications:              High risk colon cancer surveillance: Personal                            history of colon cancer Providers:                Hildred Laser, MD, Charlsie Quest. Theda Sers RN, RN, Nelma Rothman, Technician Referring MD:             Delphina Cahill, MD Medicines:                Meperidine 50 mg IV, Midazolam 6 mg IV Complications:            No immediate complications. Estimated Blood Loss:     Estimated blood loss: none. Procedure:                Pre-Anesthesia Assessment:                           - Prior to the procedure, a History and Physical                            was performed, and patient medications and                            allergies were reviewed. The patient's tolerance of                            previous anesthesia was also reviewed. The risks                            and benefits of the procedure and the sedation                            options and risks were discussed with the patient.                            All questions were answered, and informed consent                            was obtained. Prior Anticoagulants: The patient                            last took naproxen 4 days prior to the procedure.                            ASA Grade Assessment: II - A patient with mild  systemic disease. After reviewing the risks and                            benefits, the patient was deemed in satisfactory                            condition to undergo the procedure.                           After obtaining informed consent, the colonoscope                            was passed under direct vision. Throughout the                            procedure, the  patient's blood pressure, pulse, and                            oxygen saturations were monitored continuously. The                            [id number]-[id number] ([id number]) scope was introduced through                            the anus and advanced to the the ileocolonic                            anastomosis. The colonoscopy was performed without                            difficulty. The patient tolerated the procedure                            well. The quality of the bowel preparation was                            excellent. The terminal ileum and the rectum were                            photographed. Scope In: 11:37:44 AM Scope Out: 11:49:30 AM Scope Withdrawal Time: 0 hours 8 minutes 46 seconds  Total Procedure Duration: 0 hours 11 minutes 46 seconds  Findings:      The perianal and digital rectal examinations were normal.      The neo-terminal ileum appeared normal.      There was evidence of a prior end-to-side ileo-colonic anastomosis at       the hepatic flexure. This was patent and was characterized by healthy       appearing mucosa.      The rectum, recto-sigmoid colon, sigmoid colon, descending colon,       splenic flexure and transverse colon appeared normal.      The retroflexed view of the distal rectum and anal verge was normal and       showed no anal or rectal abnormalities. Impression:               -  The examined portion of the ileum was normal.                           - Patent end-to-side ileo-colonic anastomosis,                            characterized by healthy appearing mucosa.                           - The rectum, recto-sigmoid colon, sigmoid colon,                            descending colon, splenic flexure and transverse                            colon are normal.                           - No specimens collected. Moderate Sedation:      Moderate (conscious) sedation was administered by the endoscopy nurse       and supervised by the endoscopist. The  following parameters were       monitored: oxygen saturation, heart rate, blood pressure, CO2       capnography and response to care. Total physician intraservice time was       17 minutes. Recommendation:           - Patient has a contact number available for                            emergencies. The signs and symptoms of potential                            delayed complications were discussed with the                            patient. Return to normal activities tomorrow.                            Written discharge instructions were provided to the                            patient.                           - Resume previous diet today.                           - Continue present medications.                           - Repeat colonoscopy in 5 years for surveillance. Procedure Code(s):        --- Professional ---                           [id number], Colonoscopy, flexible; diagnostic, including  collection of specimen(s) by brushing or washing,                            when performed (separate procedure)                           G0500, Moderate sedation services provided by the                            same physician or other qualified health care                            professional performing a gastrointestinal                            endoscopic service that sedation supports,                            requiring the presence of an independent trained                            observer to assist in the monitoring of the                            patient's level of consciousness and physiological                            status; initial 15 minutes of intra-service time;                            patient age 65 years or older (additional time may                            be reported with [phone], as appropriate) Diagnosis Code(s):        --- Professional ---                           [phone], Personal history of other malignant                             neoplasm of large intestine                           Z98.0, Intestinal bypass and anastomosis status CPT copyright 2018 [employer]. All rights reserved. The codes documented in this report are preliminary and upon coder review may  be revised to meet current compliance requirements. Hildred Laser, MD Hildred Laser, MD 01/27/2018 11:58:52 AM This report has been signed electronically. Number of Addenda: 0

## 2018-01-31 DIAGNOSIS — Z85038 Personal history of other malignant neoplasm of large intestine: Secondary | ICD-10-CM | POA: Diagnosis not present

## 2018-01-31 DIAGNOSIS — I10 Essential (primary) hypertension: Secondary | ICD-10-CM | POA: Diagnosis not present

## 2018-01-31 DIAGNOSIS — E782 Mixed hyperlipidemia: Secondary | ICD-10-CM | POA: Diagnosis not present

## 2018-01-31 DIAGNOSIS — E1165 Type 2 diabetes mellitus with hyperglycemia: Secondary | ICD-10-CM | POA: Diagnosis not present

## 2018-02-02 ENCOUNTER — Encounter (HOSPITAL_COMMUNITY): Payer: Self-pay | Admitting: Internal Medicine

## 2018-08-11 DIAGNOSIS — E782 Mixed hyperlipidemia: Secondary | ICD-10-CM | POA: Diagnosis not present

## 2018-08-11 DIAGNOSIS — I10 Essential (primary) hypertension: Secondary | ICD-10-CM | POA: Diagnosis not present

## 2018-08-11 DIAGNOSIS — E1165 Type 2 diabetes mellitus with hyperglycemia: Secondary | ICD-10-CM | POA: Diagnosis not present

## 2018-08-16 DIAGNOSIS — E1165 Type 2 diabetes mellitus with hyperglycemia: Secondary | ICD-10-CM | POA: Diagnosis not present

## 2018-08-16 DIAGNOSIS — E782 Mixed hyperlipidemia: Secondary | ICD-10-CM | POA: Diagnosis not present

## 2018-08-16 DIAGNOSIS — Z85038 Personal history of other malignant neoplasm of large intestine: Secondary | ICD-10-CM | POA: Diagnosis not present

## 2018-08-16 DIAGNOSIS — Z136 Encounter for screening for cardiovascular disorders: Secondary | ICD-10-CM | POA: Diagnosis not present

## 2018-08-16 DIAGNOSIS — M25511 Pain in right shoulder: Secondary | ICD-10-CM | POA: Diagnosis not present

## 2018-08-16 DIAGNOSIS — I10 Essential (primary) hypertension: Secondary | ICD-10-CM | POA: Diagnosis not present

## 2018-08-16 DIAGNOSIS — Z0001 Encounter for general adult medical examination with abnormal findings: Secondary | ICD-10-CM | POA: Diagnosis not present

## 2018-08-30 DIAGNOSIS — L918 Other hypertrophic disorders of the skin: Secondary | ICD-10-CM | POA: Diagnosis not present

## 2019-02-27 DIAGNOSIS — Z0000 Encounter for general adult medical examination without abnormal findings: Secondary | ICD-10-CM | POA: Diagnosis not present

## 2019-02-27 DIAGNOSIS — Z712 Person consulting for explanation of examination or test findings: Secondary | ICD-10-CM | POA: Diagnosis not present

## 2019-02-27 DIAGNOSIS — M25511 Pain in right shoulder: Secondary | ICD-10-CM | POA: Diagnosis not present

## 2019-02-27 DIAGNOSIS — Z136 Encounter for screening for cardiovascular disorders: Secondary | ICD-10-CM | POA: Diagnosis not present

## 2019-02-27 DIAGNOSIS — E1165 Type 2 diabetes mellitus with hyperglycemia: Secondary | ICD-10-CM | POA: Diagnosis not present

## 2019-02-27 DIAGNOSIS — Z85038 Personal history of other malignant neoplasm of large intestine: Secondary | ICD-10-CM | POA: Diagnosis not present

## 2019-02-27 DIAGNOSIS — L918 Other hypertrophic disorders of the skin: Secondary | ICD-10-CM | POA: Diagnosis not present

## 2019-02-28 DIAGNOSIS — I10 Essential (primary) hypertension: Secondary | ICD-10-CM | POA: Diagnosis not present

## 2019-02-28 DIAGNOSIS — Z85038 Personal history of other malignant neoplasm of large intestine: Secondary | ICD-10-CM | POA: Diagnosis not present

## 2019-02-28 DIAGNOSIS — E1165 Type 2 diabetes mellitus with hyperglycemia: Secondary | ICD-10-CM | POA: Diagnosis not present

## 2019-02-28 DIAGNOSIS — E782 Mixed hyperlipidemia: Secondary | ICD-10-CM | POA: Diagnosis not present

## 2019-02-28 DIAGNOSIS — M25511 Pain in right shoulder: Secondary | ICD-10-CM | POA: Diagnosis not present

## 2019-08-14 DIAGNOSIS — E1165 Type 2 diabetes mellitus with hyperglycemia: Secondary | ICD-10-CM | POA: Diagnosis not present

## 2019-08-14 DIAGNOSIS — I10 Essential (primary) hypertension: Secondary | ICD-10-CM | POA: Diagnosis not present

## 2019-08-14 DIAGNOSIS — C189 Malignant neoplasm of colon, unspecified: Secondary | ICD-10-CM | POA: Diagnosis not present

## 2019-08-14 DIAGNOSIS — E559 Vitamin D deficiency, unspecified: Secondary | ICD-10-CM | POA: Diagnosis not present

## 2019-08-14 DIAGNOSIS — E782 Mixed hyperlipidemia: Secondary | ICD-10-CM | POA: Diagnosis not present

## 2019-08-18 DIAGNOSIS — Z85038 Personal history of other malignant neoplasm of large intestine: Secondary | ICD-10-CM | POA: Diagnosis not present

## 2019-08-18 DIAGNOSIS — Z0001 Encounter for general adult medical examination with abnormal findings: Secondary | ICD-10-CM | POA: Diagnosis not present

## 2019-08-18 DIAGNOSIS — C189 Malignant neoplasm of colon, unspecified: Secondary | ICD-10-CM | POA: Diagnosis not present

## 2020-02-14 DIAGNOSIS — E1165 Type 2 diabetes mellitus with hyperglycemia: Secondary | ICD-10-CM | POA: Diagnosis not present

## 2020-02-14 DIAGNOSIS — Z136 Encounter for screening for cardiovascular disorders: Secondary | ICD-10-CM | POA: Diagnosis not present

## 2020-02-14 DIAGNOSIS — M25511 Pain in right shoulder: Secondary | ICD-10-CM | POA: Diagnosis not present

## 2020-02-14 DIAGNOSIS — Z0000 Encounter for general adult medical examination without abnormal findings: Secondary | ICD-10-CM | POA: Diagnosis not present

## 2020-02-14 DIAGNOSIS — Z712 Person consulting for explanation of examination or test findings: Secondary | ICD-10-CM | POA: Diagnosis not present

## 2020-02-14 DIAGNOSIS — Z85038 Personal history of other malignant neoplasm of large intestine: Secondary | ICD-10-CM | POA: Diagnosis not present

## 2020-02-14 DIAGNOSIS — L918 Other hypertrophic disorders of the skin: Secondary | ICD-10-CM | POA: Diagnosis not present

## 2020-02-14 DIAGNOSIS — R944 Abnormal results of kidney function studies: Secondary | ICD-10-CM | POA: Diagnosis not present

## 2020-02-21 DIAGNOSIS — E1165 Type 2 diabetes mellitus with hyperglycemia: Secondary | ICD-10-CM | POA: Diagnosis not present

## 2020-02-21 DIAGNOSIS — Z85038 Personal history of other malignant neoplasm of large intestine: Secondary | ICD-10-CM | POA: Diagnosis not present

## 2020-02-21 DIAGNOSIS — I10 Essential (primary) hypertension: Secondary | ICD-10-CM | POA: Diagnosis not present

## 2020-02-21 DIAGNOSIS — E782 Mixed hyperlipidemia: Secondary | ICD-10-CM | POA: Diagnosis not present

## 2020-07-08 DIAGNOSIS — E1165 Type 2 diabetes mellitus with hyperglycemia: Secondary | ICD-10-CM | POA: Diagnosis not present

## 2020-07-08 DIAGNOSIS — I10 Essential (primary) hypertension: Secondary | ICD-10-CM | POA: Diagnosis not present

## 2020-07-08 DIAGNOSIS — E782 Mixed hyperlipidemia: Secondary | ICD-10-CM | POA: Diagnosis not present

## 2020-07-11 DIAGNOSIS — I10 Essential (primary) hypertension: Secondary | ICD-10-CM | POA: Diagnosis not present

## 2020-07-11 DIAGNOSIS — Z85038 Personal history of other malignant neoplasm of large intestine: Secondary | ICD-10-CM | POA: Diagnosis not present

## 2020-07-11 DIAGNOSIS — E782 Mixed hyperlipidemia: Secondary | ICD-10-CM | POA: Diagnosis not present

## 2020-07-11 DIAGNOSIS — E1165 Type 2 diabetes mellitus with hyperglycemia: Secondary | ICD-10-CM | POA: Diagnosis not present

## 2021-01-09 DIAGNOSIS — E1165 Type 2 diabetes mellitus with hyperglycemia: Secondary | ICD-10-CM | POA: Diagnosis not present

## 2021-01-09 DIAGNOSIS — I10 Essential (primary) hypertension: Secondary | ICD-10-CM | POA: Diagnosis not present

## 2021-01-09 DIAGNOSIS — Z85038 Personal history of other malignant neoplasm of large intestine: Secondary | ICD-10-CM | POA: Diagnosis not present

## 2021-01-13 ENCOUNTER — Other Ambulatory Visit (HOSPITAL_COMMUNITY): Payer: Self-pay | Admitting: Family Medicine

## 2021-01-13 DIAGNOSIS — E1165 Type 2 diabetes mellitus with hyperglycemia: Secondary | ICD-10-CM | POA: Diagnosis not present

## 2021-01-13 DIAGNOSIS — E782 Mixed hyperlipidemia: Secondary | ICD-10-CM | POA: Diagnosis not present

## 2021-01-13 DIAGNOSIS — Z85038 Personal history of other malignant neoplasm of large intestine: Secondary | ICD-10-CM | POA: Diagnosis not present

## 2021-01-13 DIAGNOSIS — Z1239 Encounter for other screening for malignant neoplasm of breast: Secondary | ICD-10-CM | POA: Diagnosis not present

## 2021-01-13 DIAGNOSIS — Z0001 Encounter for general adult medical examination with abnormal findings: Secondary | ICD-10-CM | POA: Diagnosis not present

## 2021-01-13 DIAGNOSIS — I10 Essential (primary) hypertension: Secondary | ICD-10-CM | POA: Diagnosis not present

## 2021-01-13 DIAGNOSIS — Z1231 Encounter for screening mammogram for malignant neoplasm of breast: Secondary | ICD-10-CM

## 2021-01-29 ENCOUNTER — Ambulatory Visit (HOSPITAL_COMMUNITY)
Admission: RE | Admit: 2021-01-29 | Discharge: 2021-01-29 | Disposition: A | Discharge status: Home / Self Care | Payer: Medicare Other | Source: Ambulatory Visit | Attending: Family Medicine | Admitting: Family Medicine

## 2021-01-29 ENCOUNTER — Other Ambulatory Visit: Payer: Self-pay

## 2021-01-29 DIAGNOSIS — Z1231 Encounter for screening mammogram for malignant neoplasm of breast: Secondary | ICD-10-CM | POA: Diagnosis not present

## 2021-02-04 NOTE — Progress Notes (Signed)
[facility] [address] [place] [address]   CLINIC:  Medical Oncology/Hematology  CONSULT NOTE  Patient Care Team: Celene Squibb, MD as PCP - General (Internal Medicine) Derek Jack, MD as Medical Oncologist (Medical Oncology)  CHIEF COMPLAINTS/PURPOSE OF CONSULTATION:  Evaluation of history of colon cancer with recently elevated CA 19-9  HISTORY OF PRESENTING ILLNESS:  [name] 65 y.o. female is here because of evaluation of history of colon cancer with recently elevated CA 19-9, at the request of Ignatius Specking, [place].  Today she reports feeling good, and she is accompanied by her daughter. She denies n/v/d/c, hematuria, hematochezia, black stools, abdominal pain, and weight loss. She reports occasional ankle swellings. She currently lives at home with her husband. Her mother had lung cancer with a history of smoking. 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 She denies excessive or unusual chemical exposure. She denies smoking history.   MEDICAL HISTORY:  Past Medical History:  Diagnosis Date   Abdominal pain, other specified site    Adenocarcinoma of cecum ([place]) 01/15/2014   Anemia    Arthritis    Colon cancer ([place])    Fibroids    Gall bladder disease    inflammation   Hypertension    Peptic ulcer    Yeast infection involving the vagina and surrounding area     SURGICAL HISTORY: Past Surgical History:  Procedure Laterality Date   bilateral nipple resections     COLON SURGERY     COLONOSCOPY     COLONOSCOPY N/A 11/29/2013   Procedure: COLONOSCOPY;  Surgeon: Rogene Houston, MD;  Location: [facility];  Service: Endoscopy;  Laterality: N/A;  1030   COLONOSCOPY N/A 01/27/2018   Procedure: COLONOSCOPY;  Surgeon: Rogene Houston, MD;  Location: [facility];  Service: Endoscopy;  Laterality: N/A;  730   egd/tcs     PORT-A-CATH REMOVAL Left 02/19/2016   Procedure: MINOR REMOVAL PORT-A-CATH;  Surgeon: Aviva Signs, MD;   Location: [facility];  Service: General;  Laterality: Left;   prot-a-cath placement      SOCIAL HISTORY: Social History   Socioeconomic History   Marital status: Married    Spouse name: Not on file   Number of children: Not on file   Years of education: Not on file   Highest education level: Not on file  Occupational History   Not on file  Tobacco Use   Smoking status: Never   Smokeless tobacco: Never  Vaping Use   Vaping Use: Never used  Substance and Sexual Activity   Alcohol use: No   Drug use: No   Sexual activity: Not Currently    Birth control/protection: Post-menopausal  Other Topics Concern   Not on file  Social History Narrative   Not on file   Social Determinants of Health   Financial Resource Strain: Not on file  Food Insecurity: Not on file  Transportation Needs: Not on file  Physical Activity: Not on file  Stress: Not on file  Social Connections: Not on file  Intimate Partner Violence: Not on file    FAMILY HISTORY: Family History  Problem Relation Age of Onset   Cancer Mother        lung cancer   Heart attack Brother    Kidney disease Sister    Thyroid nodules Daughter    Fibroids Sister     ALLERGIES:  is allergic to oxycodone-acetaminophen.  MEDICATIONS:  Current Outpatient Medications  Medication Sig  Dispense Refill   amLODipine (NORVASC) 10 MG tablet Take 1 tablet (10 mg total) by mouth daily. 30 tablet 6   atorvastatin (LIPITOR) 10 MG tablet Take by mouth.     Calcium Carbonate-Vitamin D (CALCIUM + D PO) Take by mouth daily.       cyclobenzaprine (FLEXERIL) 10 MG tablet Take 10 mg by mouth 3 (three) times daily as needed for muscle spasms.     fish oil-omega-3 fatty acids 1000 MG capsule Take 1 g by mouth daily.       fluconazole (DIFLUCAN) 100 MG tablet Take 1 tablet (100 mg total) by mouth daily. 14 tablet 0   fluticasone (FLONASE) 50 MCG/ACT nasal spray Place 2 sprays into the nose as needed for allergies.      Garlic (GARLIQUE PO)  Take by mouth.     glipiZIDE (GLUCOTROL XL) 5 MG 24 hr tablet Take 5 mg by mouth daily.     lisinopril-hydrochlorothiazide (ZESTORETIC) 20-12.5 MG tablet Take 1 tablet by mouth daily.     metFORMIN (GLUCOPHAGE) 500 MG tablet Take 1 tablet (500 mg total) by mouth 2 (two) times daily with a meal. 60 tablet 3   Multiple Vitamins-Minerals (CENTRUM SILVER PO) Take by mouth daily.       naproxen (NAPROSYN) 500 MG tablet Take 1 tablet (500 mg total) by mouth 2 (two) times daily as needed.     potassium chloride (KLOR-CON M10) 10 MEQ tablet Take 1 tablet (10 mEq total) by mouth 2 (two) times daily. 60 tablet 6   Simethicone (GAS RELIEF EXTRA STRENGTH PO) Take 1-2 tablets by mouth daily as needed (gas relief).     traMADol (ULTRAM) 50 MG tablet Take 1 tablet (50 mg total) by mouth every 6 (six) hours as needed. 60 tablet 0   TURMERIC PO Take by mouth daily.     No current facility-administered medications for this visit.    REVIEW OF SYSTEMS:   Review of Systems  Constitutional:  Negative for appetite change, fatigue and unexpected weight change.  Cardiovascular:  Positive for leg swelling (occasional).  Gastrointestinal:  Negative for abdominal pain, blood in stool, constipation, diarrhea, nausea and vomiting.  Genitourinary:  Negative for hematuria.   Musculoskeletal:  Positive for arthralgias (shoulder).  All other systems reviewed and are negative.   PHYSICAL EXAMINATION: ECOG PERFORMANCE STATUS: 0 - Asymptomatic  Vitals:   02/05/21 1319  BP: 132/76  Pulse: 89  Resp: 16  Temp: 98 F (36.7 C)  SpO2: 98%   Filed Weights   02/05/21 1319  Weight: 150 lb 5.7 oz (68.2 kg)   Physical Exam Vitals reviewed.  Constitutional:      Appearance: Normal appearance.  Cardiovascular:     Rate and Rhythm: Normal rate and regular rhythm.     Pulses: Normal pulses.     Heart sounds: Normal heart sounds.  Pulmonary:     Effort: Pulmonary effort is normal.     Breath sounds: Normal breath  sounds.  Abdominal:     Palpations: Abdomen is soft. There is no hepatomegaly, splenomegaly or mass.     Tenderness: There is no abdominal tenderness.  Musculoskeletal:     Right lower leg: No edema.     Left lower leg: No edema.  Lymphadenopathy:     Cervical: No cervical adenopathy.     Right cervical: No superficial cervical adenopathy.    Left cervical: No superficial cervical adenopathy.     Upper Body:     Right upper body:  No supraclavicular, axillary or pectoral adenopathy.     Left upper body: No supraclavicular, axillary or pectoral adenopathy.     Lower Body: No right inguinal adenopathy. No left inguinal adenopathy.  Neurological:     General: No focal deficit present.     Mental Status: She is alert and oriented to person, place, and time.  Psychiatric:        Mood and Affect: Mood normal.        Behavior: Behavior normal.     LABORATORY DATA:  I have reviewed the data as listed CBC Latest Ref Rng & Units 12/20/2015 02/12/2015 02/11/2015  WBC 4.0 - 10.5 K/uL 6.5 7.6 11.5(H)  Hemoglobin 12.0 - 15.0 g/dL 12.4 10.3(L) 11.1(L)  Hematocrit 36.0 - 46.0 % 38.2 32.1(L) 35.5(L)  Platelets 150 - 400 K/uL 211 103(L) 126(L)   CMP Latest Ref Rng & Units 12/20/2015 02/12/2015 02/11/2015  Glucose 65 - 99 mg/dL 72 258(H) 394(H)  BUN 6 - 20 mg/dL 16 16 26(H)  Creatinine 0.44 - 1.00 mg/dL 0.88 0.76 0.99  Sodium 135 - 145 mmol/L 139 146(H) 150(H)  Potassium 3.5 - 5.1 mmol/L 3.3(L) 3.8 4.3  Chloride 101 - 111 mmol/L 104 115(H) 120(H)  CO2 22 - 32 mmol/L 29 24 25   Calcium 8.9 - 10.3 mg/dL 9.1 7.6(L) 8.0(L)  Total Protein 6.5 - 8.1 g/dL 7.2 - -  Total Bilirubin 0.3 - 1.2 mg/dL 0.5 - -  Alkaline Phos 38 - 126 U/L 63 - -  AST 15 - 41 U/L 22 - -  ALT 14 - 54 U/L 17 - -    RADIOGRAPHIC STUDIES: I have personally reviewed the radiological images as listed and agreed with the findings in the report. MM 3D SCREEN BREAST BILATERAL  Result Date: 01/29/2021 CLINICAL DATA:  Screening.  EXAM: DIGITAL SCREENING BILATERAL MAMMOGRAM WITH TOMOSYNTHESIS AND CAD TECHNIQUE: Bilateral screening digital craniocaudal and mediolateral oblique mammograms were obtained. Bilateral screening digital breast tomosynthesis was performed. The images were evaluated with computer-aided detection. COMPARISON:  Previous exam(s). ACR Breast Density Category b: There are scattered areas of fibroglandular density. FINDINGS: There are no findings suspicious for malignancy. IMPRESSION: No mammographic evidence of malignancy. A result letter of this screening mammogram will be mailed directly to the patient. RECOMMENDATION: Screening mammogram in one year. (Code:SM-B-01Y) BI-RADS CATEGORY  1: Negative. Electronically Signed   By: Fidela Salisbury M.D.   On: 01/29/2021 16:45    ASSESSMENT:  History of stage III colon cancer: - Diagnosed in July 2005. - Pathology revealed 8.4 cm tumor extending through the muscularis propria into the pericolic fat, LVI not observed, 5/14 lymph nodes positive. - 6 cycles of Xeloda, oxaliplatin and Avastin from 10/02/2003 through 01/15/2004. - Subsequent scans were negative for recurrence. - Last colonoscopy on 01/27/2018 by Dr. Laural Golden showed normal ileum, patent end-to-side ileocolonic anastomosis, normal rectum, rectosigmoid colon, sigmoid colon, descending colon and splenic flexure and transverse colon.   Elevated CA 19-9: - Tumor marker testing on 01/09/2021 showed CA 19-9 elevated at 36 (reference 0-35). - She does not report any abdominal pains or weight loss.  No GI symptoms including nausea, vomiting, diarrhea or constipation.   Social/family history: - She lives at home with her husband.  She is seen today along with her daughter.  She worked as a Regulatory affairs officer in the past.  No definite chemical exposure.  Non-smoker. - Mother had lung cancer and was a smoker.   PLAN:  Elevated CA 19-9 level: - She does not have any  abdominal symptoms or weight loss.  The tumor marker  was only minimally elevated.  Hence have recommended repeating CA 19-9 level.  We will consider imaging of the abdomen showed the level remains elevated. - If the CA 19-9 is normal, she will follow-up with her PMD.   History of colon cancer: - She received adjuvant chemotherapy in 2005. - She did not have any recurrence until now. - Labs from 01/09/2021 shows CEA 4.3. - I do not believe any further testing of CEA is necessary at this time. - Patient was instructed to keep up with the colonoscopy schedule.  3.  Health maintenance: - Mammogram on 01/29/2021 reviewed showed BI-RADS Category 1.   All questions were answered. The patient knows to call the clinic with any problems, questions or concerns.   Derek Jack, MD, 02/05/21 2:03 PM  [place] [phone]   I, Thana Ates, am acting as a scribe for Dr. Derek Jack.  I, Derek Jack MD, have reviewed the above documentation for accuracy and completeness, and I agree with the above.

## 2021-02-05 ENCOUNTER — Inpatient Hospital Stay (HOSPITAL_COMMUNITY): Payer: Medicare Other | Attending: Hematology | Admitting: Hematology

## 2021-02-05 ENCOUNTER — Inpatient Hospital Stay (HOSPITAL_COMMUNITY): Payer: Medicare Other

## 2021-02-05 ENCOUNTER — Encounter (HOSPITAL_COMMUNITY): Payer: Self-pay | Admitting: Hematology

## 2021-02-05 ENCOUNTER — Other Ambulatory Visit: Payer: Self-pay

## 2021-02-05 VITALS — BP 132/76 | HR 89 | Temp 98.0°F | Resp 16 | Ht 61.02 in | Wt 150.4 lb

## 2021-02-05 DIAGNOSIS — Z8349 Family history of other endocrine, nutritional and metabolic diseases: Secondary | ICD-10-CM | POA: Diagnosis not present

## 2021-02-05 DIAGNOSIS — Z841 Family history of disorders of kidney and ureter: Secondary | ICD-10-CM | POA: Insufficient documentation

## 2021-02-05 DIAGNOSIS — C180 Malignant neoplasm of cecum: Secondary | ICD-10-CM

## 2021-02-05 DIAGNOSIS — R978 Other abnormal tumor markers: Secondary | ICD-10-CM | POA: Diagnosis not present

## 2021-02-05 DIAGNOSIS — Z1231 Encounter for screening mammogram for malignant neoplasm of breast: Secondary | ICD-10-CM | POA: Insufficient documentation

## 2021-02-05 DIAGNOSIS — Z8249 Family history of ischemic heart disease and other diseases of the circulatory system: Secondary | ICD-10-CM | POA: Insufficient documentation

## 2021-02-05 DIAGNOSIS — Z85038 Personal history of other malignant neoplasm of large intestine: Secondary | ICD-10-CM | POA: Diagnosis not present

## 2021-02-05 DIAGNOSIS — M25473 Effusion, unspecified ankle: Secondary | ICD-10-CM | POA: Insufficient documentation

## 2021-02-05 DIAGNOSIS — Z801 Family history of malignant neoplasm of trachea, bronchus and lung: Secondary | ICD-10-CM | POA: Diagnosis not present

## 2021-02-05 DIAGNOSIS — Z79899 Other long term (current) drug therapy: Secondary | ICD-10-CM | POA: Diagnosis not present

## 2021-02-05 DIAGNOSIS — Z9221 Personal history of antineoplastic chemotherapy: Secondary | ICD-10-CM | POA: Diagnosis not present

## 2021-02-05 NOTE — Patient Instructions (Addendum)
[place] at [facility] Discharge Instructions  You were seen and examined today by Dr. Delton Coombes. Dr. Delton Coombes is a medical oncologist, meaning that he specializes in cancer diagnoses. Dr. Delton Coombes discussed your past medical history, including previous history of colon cancer in 2005.  You were referred to Dr. Delton Coombes due to your previous history of colon cancer. Your recent CA19-9 was recently elevated at 36, normal range in less than 35. Dr. Delton Coombes will recheck this, it should be within normal range. We will call you with your results.  There is no need for further follow-up. Assuming that your CA19-9 is normal, you can resume routine colonoscopies and yearly physicals.   Thank you for choosing [facility] to provide your oncology and hematology care.  To afford each patient quality time with our provider, please arrive at least 15 minutes before your scheduled appointment time.   If you have a lab appointment with the [facility] please come in thru the Main Entrance and check in at the main information desk.  You need to re-schedule your appointment should you arrive 10 or more minutes late.  We strive to give you quality time with our providers, and arriving late affects you and other patients whose appointments are after yours.  Also, if you no show three or more times for appointments you may be dismissed from the clinic at the providers discretion.     Again, thank you for choosing [facility].  Our hope is that these requests will decrease the amount of time that you wait before being seen by our physicians.       _____________________________________________________________  Should you have questions after your visit to [facility], please contact our office at [phone] and follow the prompts.  Our office hours are 8:00 a.m. and 4:30 p.m. Monday - Friday.  Please note that  voicemails left after 4:00 p.m. may not be returned until the following business day.  We are closed weekends and major holidays.  You do have access to a nurse 24-7, just call the main number to the clinic [phone] and do not press any options, hold on the line and a nurse will answer the phone.    For prescription refill requests, have your pharmacy contact our office and allow 72 hours.    Due to Covid, you will need to wear a mask upon entering the hospital. If you do not have a mask, a mask will be given to you at the Main Entrance upon arrival. For doctor visits, patients may have 1 support person age 14 or older with them. For treatment visits, patients can not have anyone with them due to social distancing guidelines and our immunocompromised population.

## 2021-02-06 LAB — CANCER ANTIGEN 19-9: CA 19-9: 37 U/mL — ABNORMAL HIGH (ref 0–35)

## 2021-07-09 DIAGNOSIS — E119 Type 2 diabetes mellitus without complications: Secondary | ICD-10-CM | POA: Diagnosis not present

## 2021-07-09 DIAGNOSIS — E1165 Type 2 diabetes mellitus with hyperglycemia: Secondary | ICD-10-CM | POA: Diagnosis not present

## 2021-07-09 DIAGNOSIS — Z8509 Personal history of malignant neoplasm of other digestive organs: Secondary | ICD-10-CM | POA: Diagnosis not present

## 2021-07-09 DIAGNOSIS — E782 Mixed hyperlipidemia: Secondary | ICD-10-CM | POA: Diagnosis not present

## 2021-07-09 DIAGNOSIS — R978 Other abnormal tumor markers: Secondary | ICD-10-CM | POA: Diagnosis not present

## 2021-07-09 DIAGNOSIS — C189 Malignant neoplasm of colon, unspecified: Secondary | ICD-10-CM | POA: Diagnosis not present

## 2021-07-09 DIAGNOSIS — I10 Essential (primary) hypertension: Secondary | ICD-10-CM | POA: Diagnosis not present

## 2021-07-14 DIAGNOSIS — H9209 Otalgia, unspecified ear: Secondary | ICD-10-CM | POA: Diagnosis not present

## 2021-07-14 DIAGNOSIS — E1165 Type 2 diabetes mellitus with hyperglycemia: Secondary | ICD-10-CM | POA: Diagnosis not present

## 2021-07-14 DIAGNOSIS — Z85038 Personal history of other malignant neoplasm of large intestine: Secondary | ICD-10-CM | POA: Diagnosis not present

## 2021-07-14 DIAGNOSIS — M1990 Unspecified osteoarthritis, unspecified site: Secondary | ICD-10-CM | POA: Diagnosis not present

## 2021-07-14 DIAGNOSIS — I10 Essential (primary) hypertension: Secondary | ICD-10-CM | POA: Diagnosis not present

## 2021-07-14 DIAGNOSIS — E782 Mixed hyperlipidemia: Secondary | ICD-10-CM | POA: Diagnosis not present

## 2022-01-19 DIAGNOSIS — E1165 Type 2 diabetes mellitus with hyperglycemia: Secondary | ICD-10-CM | POA: Diagnosis not present

## 2022-01-19 DIAGNOSIS — Z85038 Personal history of other malignant neoplasm of large intestine: Secondary | ICD-10-CM | POA: Diagnosis not present

## 2022-01-19 DIAGNOSIS — I10 Essential (primary) hypertension: Secondary | ICD-10-CM | POA: Diagnosis not present

## 2022-01-19 DIAGNOSIS — E782 Mixed hyperlipidemia: Secondary | ICD-10-CM | POA: Diagnosis not present

## 2022-01-22 DIAGNOSIS — E1165 Type 2 diabetes mellitus with hyperglycemia: Secondary | ICD-10-CM | POA: Diagnosis not present

## 2022-01-22 DIAGNOSIS — J302 Other seasonal allergic rhinitis: Secondary | ICD-10-CM | POA: Diagnosis not present

## 2022-01-22 DIAGNOSIS — M1990 Unspecified osteoarthritis, unspecified site: Secondary | ICD-10-CM | POA: Diagnosis not present

## 2022-01-22 DIAGNOSIS — Z0000 Encounter for general adult medical examination without abnormal findings: Secondary | ICD-10-CM | POA: Diagnosis not present

## 2022-01-22 DIAGNOSIS — R109 Unspecified abdominal pain: Secondary | ICD-10-CM | POA: Diagnosis not present

## 2022-01-22 DIAGNOSIS — Z85038 Personal history of other malignant neoplasm of large intestine: Secondary | ICD-10-CM | POA: Diagnosis not present

## 2022-01-22 DIAGNOSIS — I10 Essential (primary) hypertension: Secondary | ICD-10-CM | POA: Diagnosis not present

## 2022-01-22 DIAGNOSIS — E782 Mixed hyperlipidemia: Secondary | ICD-10-CM | POA: Diagnosis not present

## 2022-05-18 IMAGING — MG MM DIGITAL SCREENING BILAT W/ TOMO AND CAD
8 series · 9 of 24 positions shown · non-contrast
Comparison: Previous exam(s).

CLINICAL DATA: Screening.

EXAM:
DIGITAL SCREENING BILATERAL MAMMOGRAM WITH TOMOSYNTHESIS AND CAD
TECHNIQUE: Bilateral screening digital craniocaudal and mediolateral oblique
mammograms were obtained. Bilateral screening digital breast
tomosynthesis was performed. The images were evaluated with
computer-aided detection.

[L MLO synth-2D]
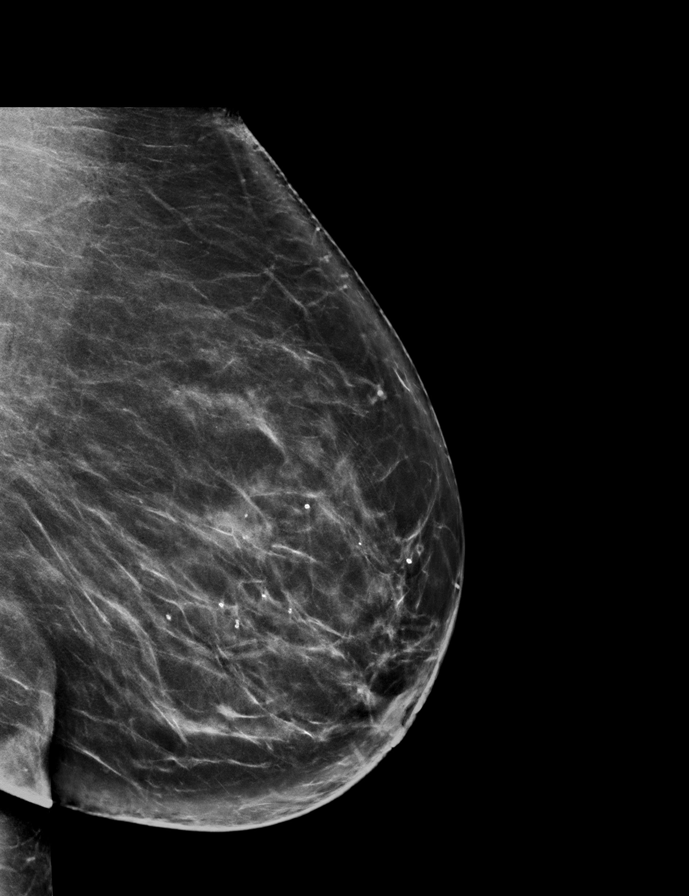

[R MLO synth-2D]
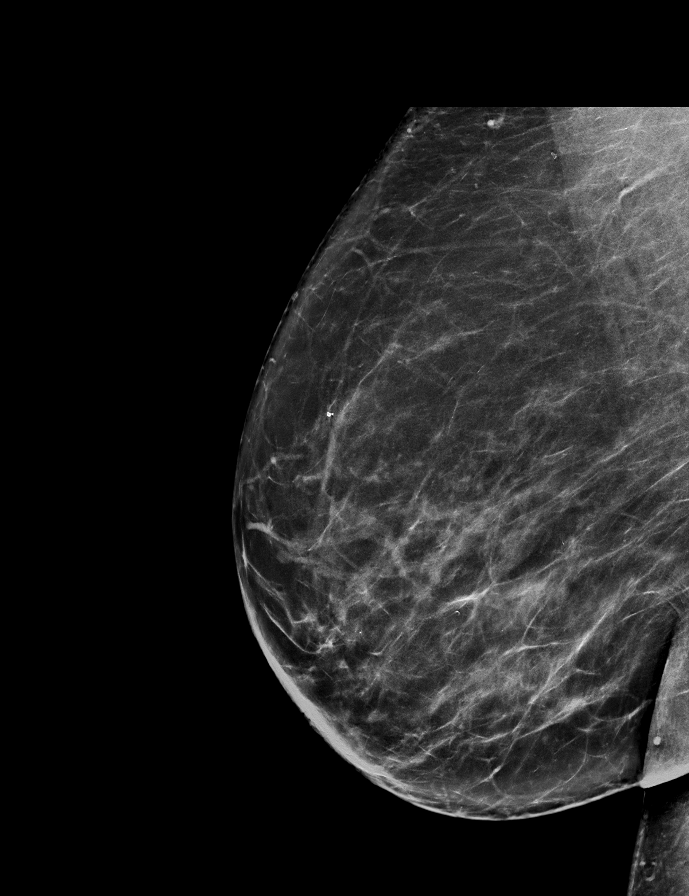

[L CC synth-2D]
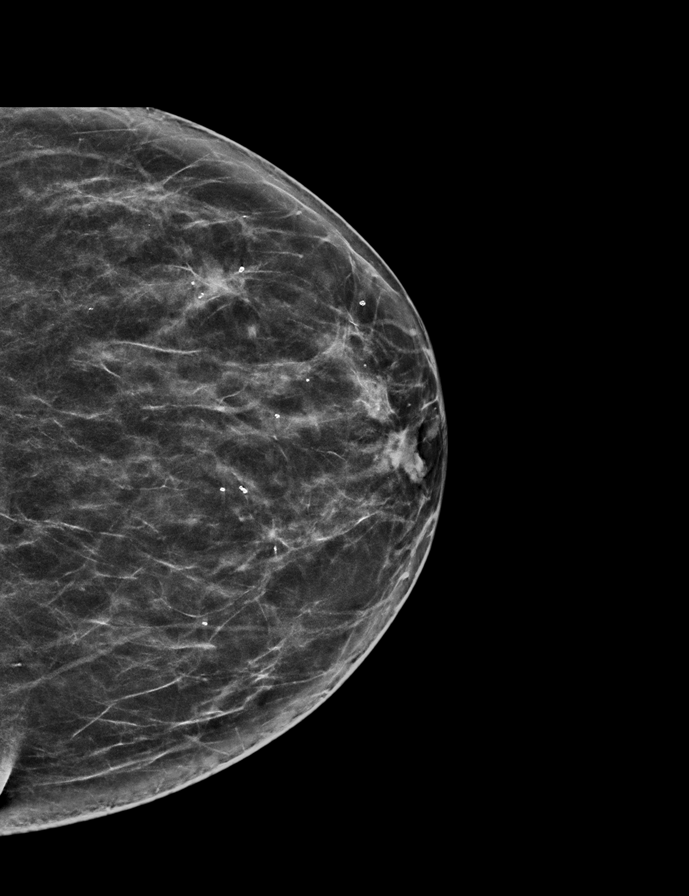

[R CC synth-2D]
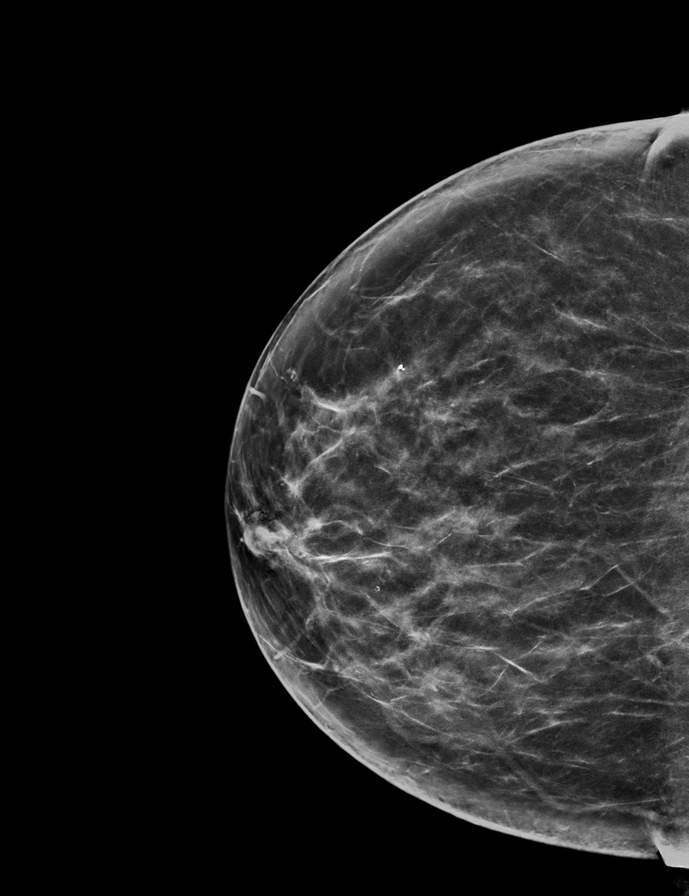

[R MLO tomo · 2 of 79 frames shown]
[frame 26/79]
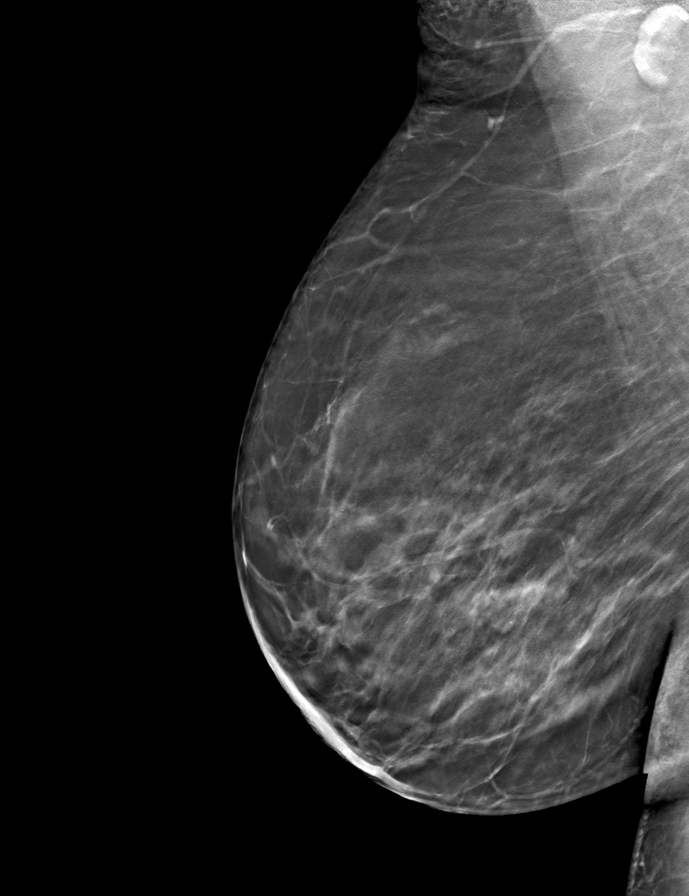
[frame 40/79]
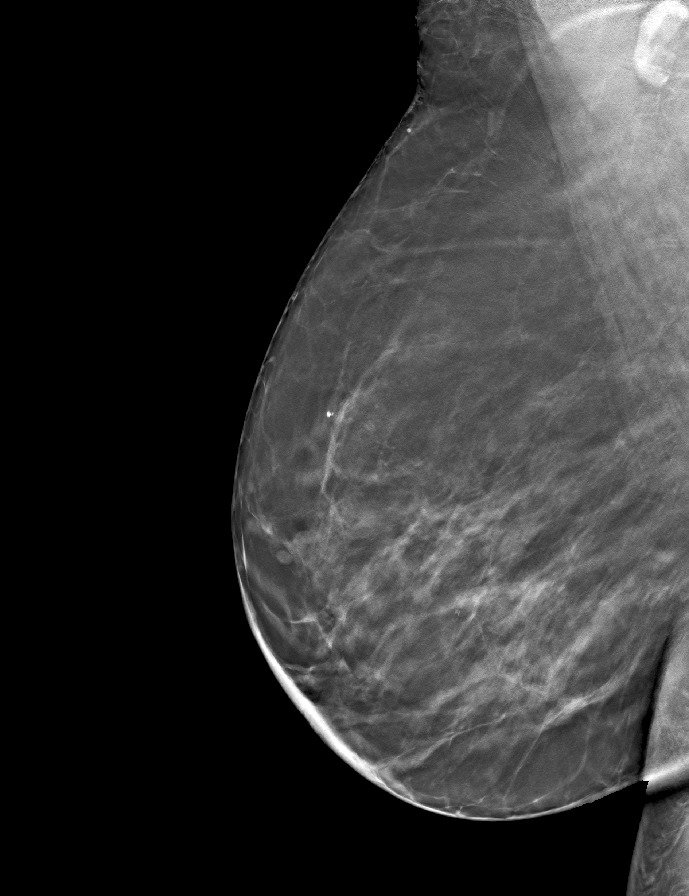

[L MLO tomo · tomo slice 41/80.0]
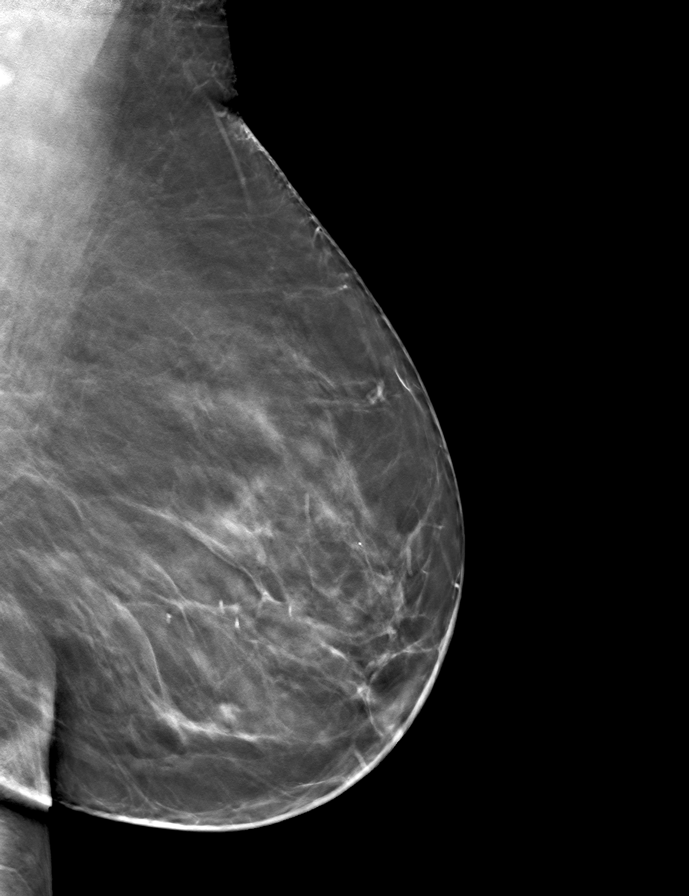

[L CC tomo · tomo slice 33/66.0]
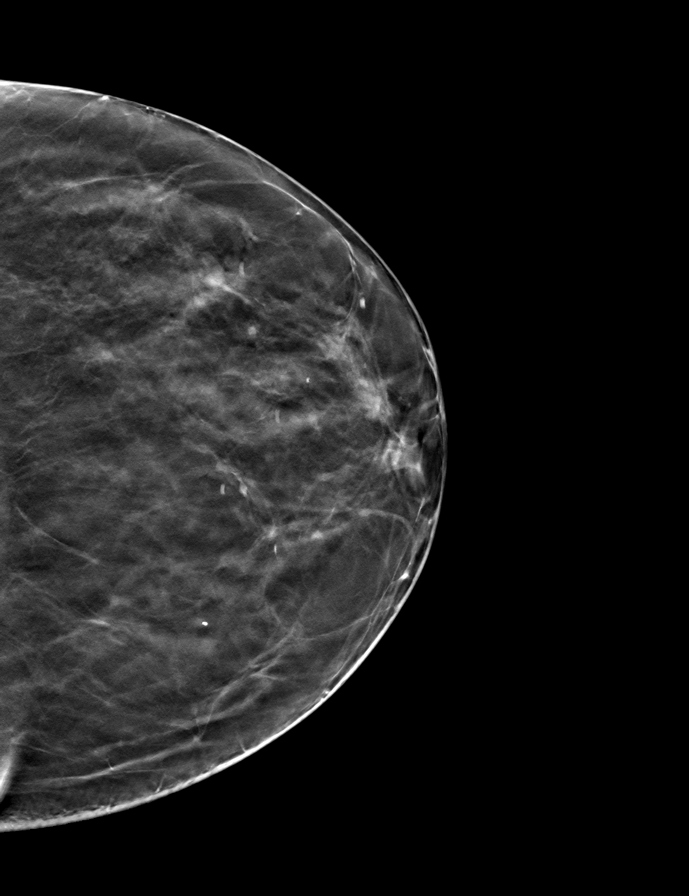

[R CC tomo · tomo slice 37/73.0]
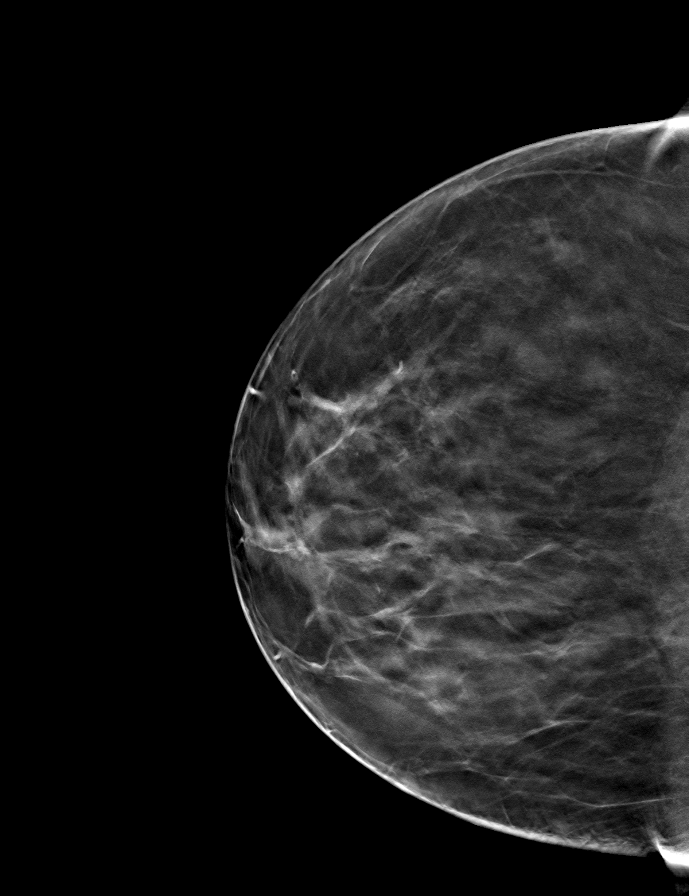

[9 of 24 positions shown; findings below may reference images not displayed]

ACR Breast Density Category b: There are scattered areas of
fibroglandular density.
FINDINGS: There are no findings suspicious for malignancy.
IMPRESSION: No mammographic evidence of malignancy. A result letter of this
screening mammogram will be mailed directly to the patient.

RECOMMENDATION:
Screening mammogram in one year. (Code:51-O-LD2)

BI-RADS CATEGORY  1: Negative.

## 2022-07-17 DIAGNOSIS — E1165 Type 2 diabetes mellitus with hyperglycemia: Secondary | ICD-10-CM | POA: Diagnosis not present

## 2022-07-17 DIAGNOSIS — I10 Essential (primary) hypertension: Secondary | ICD-10-CM | POA: Diagnosis not present

## 2022-07-17 DIAGNOSIS — E782 Mixed hyperlipidemia: Secondary | ICD-10-CM | POA: Diagnosis not present

## 2022-07-23 ENCOUNTER — Other Ambulatory Visit (HOSPITAL_COMMUNITY): Payer: Self-pay | Admitting: Family Medicine

## 2022-07-23 DIAGNOSIS — Z7984 Long term (current) use of oral hypoglycemic drugs: Secondary | ICD-10-CM | POA: Diagnosis not present

## 2022-07-23 DIAGNOSIS — M1990 Unspecified osteoarthritis, unspecified site: Secondary | ICD-10-CM | POA: Diagnosis not present

## 2022-07-23 DIAGNOSIS — J302 Other seasonal allergic rhinitis: Secondary | ICD-10-CM | POA: Diagnosis not present

## 2022-07-23 DIAGNOSIS — E1165 Type 2 diabetes mellitus with hyperglycemia: Secondary | ICD-10-CM

## 2022-07-23 DIAGNOSIS — E782 Mixed hyperlipidemia: Secondary | ICD-10-CM | POA: Diagnosis not present

## 2022-07-23 DIAGNOSIS — Z85038 Personal history of other malignant neoplasm of large intestine: Secondary | ICD-10-CM | POA: Diagnosis not present

## 2022-07-23 DIAGNOSIS — I10 Essential (primary) hypertension: Secondary | ICD-10-CM | POA: Diagnosis not present

## 2022-07-23 DIAGNOSIS — Z0001 Encounter for general adult medical examination with abnormal findings: Secondary | ICD-10-CM | POA: Diagnosis not present

## 2022-07-23 DIAGNOSIS — T50905D Adverse effect of unspecified drugs, medicaments and biological substances, subsequent encounter: Secondary | ICD-10-CM | POA: Diagnosis not present

## 2022-08-07 ENCOUNTER — Ambulatory Visit (HOSPITAL_COMMUNITY)
Admission: RE | Admit: 2022-08-07 | Discharge: 2022-08-07 | Disposition: A | Discharge status: Home / Self Care | Payer: Medicare Other | Source: Ambulatory Visit | Attending: Family Medicine | Admitting: Family Medicine

## 2022-08-07 DIAGNOSIS — E1165 Type 2 diabetes mellitus with hyperglycemia: Secondary | ICD-10-CM

## 2022-08-07 DIAGNOSIS — R109 Unspecified abdominal pain: Secondary | ICD-10-CM | POA: Diagnosis not present

## 2022-08-07 DIAGNOSIS — R110 Nausea: Secondary | ICD-10-CM | POA: Diagnosis not present

## 2022-08-07 DIAGNOSIS — I700 Atherosclerosis of aorta: Secondary | ICD-10-CM | POA: Insufficient documentation

## 2022-08-07 LAB — POCT I-STAT CREATININE: Creatinine, Ser: 1 mg/dL (ref 0.44–1.00)

## 2022-08-07 MED ORDER — IOHEXOL 9 MG/ML PO SOLN
ORAL | Status: AC
  Filled 2022-08-07: qty 1000

## 2022-08-07 MED ORDER — IOHEXOL 300 MG/ML  SOLN
100.0000 mL | Freq: Once | INTRAMUSCULAR | Status: AC | PRN
  Administered 2022-08-07: 100 mL via INTRAVENOUS

## 2023-01-18 ENCOUNTER — Encounter (INDEPENDENT_AMBULATORY_CARE_PROVIDER_SITE_OTHER): Payer: Self-pay | Admitting: *Deleted

## 2023-02-22 ENCOUNTER — Telehealth (INDEPENDENT_AMBULATORY_CARE_PROVIDER_SITE_OTHER): Payer: Self-pay | Admitting: Gastroenterology

## 2023-02-22 NOTE — Telephone Encounter (Signed)
Who is your primary care physician: Dr.Zack Margo Aye  Reasons for the colonoscopy: 5 year recall  Have you had a colonoscopy before: yes  Do you have family history of colon cancer? no  Previous colonoscopy with polyps removed? yes  Do you have a history colorectal cancer?   no  Are you diabetic? If yes, Type 1 or Type 2?    Type 2  Do you have a prosthetic or mechanical heart valve? no  Do you have a pacemaker/defibrillator?   no  Have you had endocarditis/atrial fibrillation? no  Have you had joint replacement within the last 12 months?  no  Do you tend to be constipated or have to use laxatives? no  Do you have any history of drugs or alchohol?  no  Do you use supplemental oxygen?  no  Have you had a stroke or heart attack within the last 6 months? no  Do you take weight loss medication?  no  For female patients: have you had a hysterectomy?  no                                     are you post menopausal?                                                   do you still have your menstrual cycle? no      Do you take any blood-thinning medications such as: (aspirin, warfarin, Plavix, Aggrenox)  no  If yes we need the name, milligram, dosage and who is prescribing doctor  Current Outpatient Medications on File Prior to Visit  Medication Sig Dispense Refill   amLODipine (NORVASC) 10 MG tablet Take 1 tablet (10 mg total) by mouth daily. 30 tablet 6   atorvastatin (LIPITOR) 10 MG tablet Take by mouth.     Calcium Carbonate-Vitamin D (CALCIUM + D PO) Take by mouth daily.       cyclobenzaprine (FLEXERIL) 10 MG tablet Take 10 mg by mouth 3 (three) times daily as needed for muscle spasms.     fish oil-omega-3 fatty acids 1000 MG capsule Take 1 g by mouth daily.       fluconazole (DIFLUCAN) 100 MG tablet Take 1 tablet (100 mg total) by mouth daily. (Patient not taking: Reported on 02/22/2023) 14 tablet 0   fluticasone (FLONASE) 50 MCG/ACT nasal spray Place 2 sprays into the nose as  needed for allergies.  (Patient not taking: Reported on 02/22/2023)     Garlic (GARLIQUE PO) Take by mouth. (Patient not taking: Reported on 02/22/2023)     glipiZIDE (GLUCOTROL XL) 5 MG 24 hr tablet Take 5 mg by mouth daily.     lisinopril-hydrochlorothiazide (ZESTORETIC) 20-12.5 MG tablet Take 1 tablet by mouth daily.     metFORMIN (GLUCOPHAGE) 500 MG tablet Take 1 tablet (500 mg total) by mouth 2 (two) times daily with a meal. 60 tablet 3   Multiple Vitamins-Minerals (CENTRUM SILVER PO) Take by mouth daily.       naproxen (NAPROSYN) 500 MG tablet Take 1 tablet (500 mg total) by mouth 2 (two) times daily as needed. (Patient not taking: Reported on 02/22/2023)     potassium chloride (KLOR-CON M10) 10 MEQ tablet Take 1 tablet (10 mEq total) by mouth 2 (  two) times daily. 60 tablet 6   Simethicone (GAS RELIEF EXTRA STRENGTH PO) Take 1-2 tablets by mouth daily as needed (gas relief).     traMADol (ULTRAM) 50 MG tablet Take 1 tablet (50 mg total) by mouth every 6 (six) hours as needed. (Patient not taking: Reported on 02/22/2023) 60 tablet 0   TURMERIC PO Take by mouth daily. (Patient not taking: Reported on 02/22/2023)     No current facility-administered medications on file prior to visit.    Allergies  Allergen Reactions   Oxycodone-Acetaminophen     "burning up"      Pharmacy: [facility]   Primary Insurance Name: [employer] number where you can be reached: [phone]

## 2023-02-22 NOTE — Telephone Encounter (Signed)
 Ok to schedule.  Room 1/2  Thanks,  Vista Lawman, MD Gastroenterology and Hepatology [facility]

## 2023-02-26 NOTE — Telephone Encounter (Signed)
Attempted to reach pt but no answer

## 2023-03-12 NOTE — Telephone Encounter (Signed)
Unable to reach pt.  Will send letter.

## 2023-06-08 ENCOUNTER — Telehealth: Payer: Self-pay

## 2023-06-08 NOTE — Progress Notes (Signed)
   06/08/2023  Patient ID: Vanessa [name], female   DOB: 08/21/1955, 68 y.o.   MRN: [id number]  Pharmacy Quality Measure Review  This patient is appearing on a report for being at risk of failing the Glycemic Status Assessment in Diabetes measure this calendar year.   Last documented BP 148/80 on 07/23/22  Last documented A1c or GMI 13.8 on 07/23/22  Spoke with patient, she hasn't had a follow up since May of last year. She agreed to schedule a follow up with labs, I contacted scheduling to set this up. Once scheduled for PCP follow up I will reach back out to schedule a visit with me to likely help manage diabetes, hypertension, and hyperlipidemia.   Of note patient is reluctant to add meds for both pill burden and side effect reasons. Will need to collaborate with patient and PCP to minimize meds and optimize regimen.  Flint Hummer, PharmD

## 2023-06-14 DIAGNOSIS — I10 Essential (primary) hypertension: Secondary | ICD-10-CM | POA: Diagnosis not present

## 2023-06-14 DIAGNOSIS — E782 Mixed hyperlipidemia: Secondary | ICD-10-CM | POA: Diagnosis not present

## 2023-06-14 DIAGNOSIS — E1165 Type 2 diabetes mellitus with hyperglycemia: Secondary | ICD-10-CM | POA: Diagnosis not present

## 2023-06-16 ENCOUNTER — Telehealth: Payer: Self-pay

## 2023-06-16 NOTE — Progress Notes (Signed)
   06/16/2023  Patient ID: Vanessa [name], female   DOB: April 18, 1955, 68 y.o.   MRN: [id number]  Pharmacy Quality Measure Review  This patient is appearing on a report for being at risk of failing the Glycemic Status Assessment in Diabetes measure this calendar year.   Last documented BP 148/80 on 07/23/22  Last documented A1c or GMI 7.0 on 06/14/23.  Spoke with patient, A1C is back within her typical range and under control. Since she is at goal may not need to follow her for diabetes. She is however above LDL goal of <70 and is taking atorvastatin 10 mg MWF. She prefers to continue taking this MWF but was open to a dose increase to 20 mg. Will consult with PCP about this change, she sees him for follow up tomorrow.Of note she has only been taking amlodipine  for BP, has not refilled lisinopril /hydrochlorothiazide  since last year. Does not check BP at home and doesn't want to. Will review BP in office after visit tomorrow and would recommend restarting at least lisinopril  if BP is not at goal.   Flint Hummer, PharmD

## 2023-06-17 DIAGNOSIS — M25472 Effusion, left ankle: Secondary | ICD-10-CM | POA: Diagnosis not present

## 2023-06-17 DIAGNOSIS — M1990 Unspecified osteoarthritis, unspecified site: Secondary | ICD-10-CM | POA: Diagnosis not present

## 2023-06-17 DIAGNOSIS — G8929 Other chronic pain: Secondary | ICD-10-CM | POA: Diagnosis not present

## 2023-06-17 DIAGNOSIS — E782 Mixed hyperlipidemia: Secondary | ICD-10-CM | POA: Diagnosis not present

## 2023-06-17 DIAGNOSIS — M25471 Effusion, right ankle: Secondary | ICD-10-CM | POA: Diagnosis not present

## 2023-06-17 DIAGNOSIS — J302 Other seasonal allergic rhinitis: Secondary | ICD-10-CM | POA: Diagnosis not present

## 2023-06-17 DIAGNOSIS — M25512 Pain in left shoulder: Secondary | ICD-10-CM | POA: Diagnosis not present

## 2023-06-17 DIAGNOSIS — I10 Essential (primary) hypertension: Secondary | ICD-10-CM | POA: Diagnosis not present

## 2023-06-17 DIAGNOSIS — Z85038 Personal history of other malignant neoplasm of large intestine: Secondary | ICD-10-CM | POA: Diagnosis not present

## 2023-06-17 DIAGNOSIS — E1165 Type 2 diabetes mellitus with hyperglycemia: Secondary | ICD-10-CM | POA: Diagnosis not present

## 2023-07-05 ENCOUNTER — Telehealth: Payer: Self-pay

## 2023-07-05 NOTE — Progress Notes (Signed)
   07/05/2023  Patient ID: Vanessa [name], female   DOB: 04/12/1955, 68 y.o.   MRN: [id number]  Medication Adherence:  Documentation in [facility].  Flint Hummer, PharmD

## 2023-09-22 ENCOUNTER — Telehealth: Payer: Self-pay

## 2023-09-22 NOTE — Telephone Encounter (Signed)
 Up to date on meds, next review in September

## 2023-12-13 DIAGNOSIS — I10 Essential (primary) hypertension: Secondary | ICD-10-CM | POA: Diagnosis not present

## 2023-12-13 DIAGNOSIS — E782 Mixed hyperlipidemia: Secondary | ICD-10-CM | POA: Diagnosis not present

## 2023-12-13 DIAGNOSIS — Z85038 Personal history of other malignant neoplasm of large intestine: Secondary | ICD-10-CM | POA: Diagnosis not present

## 2023-12-13 DIAGNOSIS — D649 Anemia, unspecified: Secondary | ICD-10-CM | POA: Diagnosis not present

## 2023-12-13 DIAGNOSIS — E1165 Type 2 diabetes mellitus with hyperglycemia: Secondary | ICD-10-CM | POA: Diagnosis not present

## 2023-12-17 ENCOUNTER — Other Ambulatory Visit (HOSPITAL_COMMUNITY): Payer: Self-pay | Admitting: Internal Medicine

## 2023-12-17 DIAGNOSIS — M25471 Effusion, right ankle: Secondary | ICD-10-CM | POA: Diagnosis not present

## 2023-12-17 DIAGNOSIS — G8929 Other chronic pain: Secondary | ICD-10-CM | POA: Diagnosis not present

## 2023-12-17 DIAGNOSIS — M25472 Effusion, left ankle: Secondary | ICD-10-CM | POA: Diagnosis not present

## 2023-12-17 DIAGNOSIS — E782 Mixed hyperlipidemia: Secondary | ICD-10-CM | POA: Diagnosis not present

## 2023-12-17 DIAGNOSIS — J302 Other seasonal allergic rhinitis: Secondary | ICD-10-CM | POA: Diagnosis not present

## 2023-12-17 DIAGNOSIS — Z0000 Encounter for general adult medical examination without abnormal findings: Secondary | ICD-10-CM | POA: Diagnosis not present

## 2023-12-17 DIAGNOSIS — M25512 Pain in left shoulder: Secondary | ICD-10-CM | POA: Diagnosis not present

## 2023-12-17 DIAGNOSIS — E1165 Type 2 diabetes mellitus with hyperglycemia: Secondary | ICD-10-CM | POA: Diagnosis not present

## 2023-12-17 DIAGNOSIS — Z1231 Encounter for screening mammogram for malignant neoplasm of breast: Secondary | ICD-10-CM

## 2023-12-17 DIAGNOSIS — I10 Essential (primary) hypertension: Secondary | ICD-10-CM | POA: Diagnosis not present

## 2023-12-17 DIAGNOSIS — M1990 Unspecified osteoarthritis, unspecified site: Secondary | ICD-10-CM | POA: Diagnosis not present

## 2023-12-17 DIAGNOSIS — Z0001 Encounter for general adult medical examination with abnormal findings: Secondary | ICD-10-CM | POA: Diagnosis not present
